# Patient Record
Sex: Female | Born: 1945 | Race: White | Hispanic: No | Marital: Married | Smoking: Never smoker
Health system: Southern US, Community
[De-identification: ages and names within clinical notes are randomized; demographics above are authoritative.]

## PROBLEM LIST (undated history)

## (undated) DIAGNOSIS — I071 Rheumatic tricuspid insufficiency: Secondary | ICD-10-CM

## (undated) DIAGNOSIS — K8050 Calculus of bile duct without cholangitis or cholecystitis without obstruction: Secondary | ICD-10-CM

## (undated) DIAGNOSIS — I4891 Unspecified atrial fibrillation: Secondary | ICD-10-CM

## (undated) DIAGNOSIS — D509 Iron deficiency anemia, unspecified: Secondary | ICD-10-CM

## (undated) DIAGNOSIS — F32A Depression, unspecified: Secondary | ICD-10-CM

## (undated) DIAGNOSIS — Z9884 Bariatric surgery status: Secondary | ICD-10-CM

## (undated) DIAGNOSIS — D649 Anemia, unspecified: Secondary | ICD-10-CM

## (undated) DIAGNOSIS — E282 Polycystic ovarian syndrome: Secondary | ICD-10-CM

## (undated) DIAGNOSIS — M1990 Unspecified osteoarthritis, unspecified site: Secondary | ICD-10-CM

## (undated) HISTORY — DX: Anemia, unspecified: D64.9

## (undated) HISTORY — PX: GASTRIC BYPASS: SHX52

## (undated) HISTORY — DX: Rheumatic tricuspid insufficiency: I07.1

## (undated) HISTORY — DX: Unspecified osteoarthritis, unspecified site: M19.90

## (undated) HISTORY — PX: TONSILLECTOMY: SUR1361

## (undated) HISTORY — DX: Iron deficiency anemia, unspecified: D50.9

## (undated) HISTORY — DX: Depression, unspecified: F32.A

## (undated) HISTORY — DX: Unspecified atrial fibrillation: I48.91

## (undated) HISTORY — PX: CHOLECYSTECTOMY: SHX55

## (undated) HISTORY — DX: Calculus of bile duct without cholangitis or cholecystitis without obstruction: K80.50

## (undated) HISTORY — PX: FOOT FRACTURE SURGERY: SHX645

## (undated) HISTORY — PX: REPLACEMENT TOTAL KNEE: SUR1224

---

## 2014-07-30 DIAGNOSIS — F341 Dysthymic disorder: Secondary | ICD-10-CM | POA: Diagnosis not present

## 2014-08-13 DIAGNOSIS — F341 Dysthymic disorder: Secondary | ICD-10-CM | POA: Diagnosis not present

## 2014-08-27 DIAGNOSIS — F341 Dysthymic disorder: Secondary | ICD-10-CM | POA: Diagnosis not present

## 2014-09-10 DIAGNOSIS — F341 Dysthymic disorder: Secondary | ICD-10-CM | POA: Diagnosis not present

## 2014-09-17 DIAGNOSIS — F341 Dysthymic disorder: Secondary | ICD-10-CM | POA: Diagnosis not present

## 2014-09-24 DIAGNOSIS — F341 Dysthymic disorder: Secondary | ICD-10-CM | POA: Diagnosis not present

## 2014-10-01 DIAGNOSIS — F341 Dysthymic disorder: Secondary | ICD-10-CM | POA: Diagnosis not present

## 2014-10-08 DIAGNOSIS — F341 Dysthymic disorder: Secondary | ICD-10-CM | POA: Diagnosis not present

## 2014-10-15 DIAGNOSIS — F341 Dysthymic disorder: Secondary | ICD-10-CM | POA: Diagnosis not present

## 2014-10-18 DIAGNOSIS — F341 Dysthymic disorder: Secondary | ICD-10-CM | POA: Diagnosis not present

## 2014-10-22 DIAGNOSIS — F341 Dysthymic disorder: Secondary | ICD-10-CM | POA: Diagnosis not present

## 2014-10-26 DIAGNOSIS — F341 Dysthymic disorder: Secondary | ICD-10-CM | POA: Diagnosis not present

## 2014-10-30 DIAGNOSIS — M9903 Segmental and somatic dysfunction of lumbar region: Secondary | ICD-10-CM | POA: Diagnosis not present

## 2014-10-30 DIAGNOSIS — M5431 Sciatica, right side: Secondary | ICD-10-CM | POA: Diagnosis not present

## 2014-10-31 DIAGNOSIS — F341 Dysthymic disorder: Secondary | ICD-10-CM | POA: Diagnosis not present

## 2014-11-05 DIAGNOSIS — F341 Dysthymic disorder: Secondary | ICD-10-CM | POA: Diagnosis not present

## 2014-11-12 DIAGNOSIS — F341 Dysthymic disorder: Secondary | ICD-10-CM | POA: Diagnosis not present

## 2014-11-16 DIAGNOSIS — F341 Dysthymic disorder: Secondary | ICD-10-CM | POA: Diagnosis not present

## 2014-11-19 DIAGNOSIS — F341 Dysthymic disorder: Secondary | ICD-10-CM | POA: Diagnosis not present

## 2015-01-25 DIAGNOSIS — F341 Dysthymic disorder: Secondary | ICD-10-CM | POA: Diagnosis not present

## 2015-01-29 DIAGNOSIS — F341 Dysthymic disorder: Secondary | ICD-10-CM | POA: Diagnosis not present

## 2015-02-04 DIAGNOSIS — F341 Dysthymic disorder: Secondary | ICD-10-CM | POA: Diagnosis not present

## 2015-03-22 DIAGNOSIS — F341 Dysthymic disorder: Secondary | ICD-10-CM | POA: Diagnosis not present

## 2015-03-25 DIAGNOSIS — F341 Dysthymic disorder: Secondary | ICD-10-CM | POA: Diagnosis not present

## 2015-03-29 DIAGNOSIS — F341 Dysthymic disorder: Secondary | ICD-10-CM | POA: Diagnosis not present

## 2015-04-02 DIAGNOSIS — H905 Unspecified sensorineural hearing loss: Secondary | ICD-10-CM | POA: Diagnosis not present

## 2015-04-02 DIAGNOSIS — H9313 Tinnitus, bilateral: Secondary | ICD-10-CM | POA: Diagnosis not present

## 2015-04-04 ENCOUNTER — Other Ambulatory Visit: Payer: Self-pay | Admitting: Otolaryngology

## 2015-04-04 DIAGNOSIS — H9190 Unspecified hearing loss, unspecified ear: Secondary | ICD-10-CM

## 2015-04-17 ENCOUNTER — Ambulatory Visit
Admission: RE | Admit: 2015-04-17 | Discharge: 2015-04-17 | Disposition: A | Discharge status: Home / Self Care | Payer: Medicare Other | Source: Ambulatory Visit | Attending: Otolaryngology | Admitting: Otolaryngology

## 2015-04-17 DIAGNOSIS — H9190 Unspecified hearing loss, unspecified ear: Secondary | ICD-10-CM

## 2015-04-17 DIAGNOSIS — H9193 Unspecified hearing loss, bilateral: Secondary | ICD-10-CM | POA: Diagnosis not present

## 2016-07-31 DIAGNOSIS — M25531 Pain in right wrist: Secondary | ICD-10-CM | POA: Diagnosis not present

## 2016-07-31 DIAGNOSIS — S56011A Strain of flexor muscle, fascia and tendon of right thumb at forearm level, initial encounter: Secondary | ICD-10-CM | POA: Diagnosis not present

## 2016-08-19 DIAGNOSIS — M25531 Pain in right wrist: Secondary | ICD-10-CM | POA: Diagnosis not present

## 2016-08-19 DIAGNOSIS — S56011D Strain of flexor muscle, fascia and tendon of right thumb at forearm level, subsequent encounter: Secondary | ICD-10-CM | POA: Diagnosis not present

## 2017-06-19 ENCOUNTER — Encounter (HOSPITAL_COMMUNITY): Payer: Self-pay | Admitting: Emergency Medicine

## 2017-06-19 ENCOUNTER — Ambulatory Visit (HOSPITAL_COMMUNITY)
Admission: EM | Admit: 2017-06-19 | Discharge: 2017-06-19 | Disposition: A | Discharge status: Home / Self Care | Payer: Medicare Other | Attending: Internal Medicine | Admitting: Internal Medicine

## 2017-06-19 ENCOUNTER — Other Ambulatory Visit: Payer: Self-pay

## 2017-06-19 DIAGNOSIS — R21 Rash and other nonspecific skin eruption: Secondary | ICD-10-CM | POA: Diagnosis not present

## 2017-06-19 HISTORY — DX: Polycystic ovarian syndrome: E28.2

## 2017-06-19 HISTORY — DX: Bariatric surgery status: Z98.84

## 2017-06-19 MED ORDER — FLUCONAZOLE 200 MG PO TABS: 200.0000 mg | ORAL_TABLET | Freq: Every day | ORAL | 0 refills | Status: AC

## 2017-06-19 MED ORDER — TRAZODONE HCL 50 MG PO TABS: 25.0000 mg | ORAL_TABLET | Freq: Every day | ORAL | 0 refills | Status: DC

## 2017-06-19 MED ORDER — TRIAMCINOLONE ACETONIDE 0.1 % EX CREA: 1.0000 "application " | TOPICAL_CREAM | Freq: Two times a day (BID) | CUTANEOUS | 0 refills | Status: DC

## 2017-06-19 MED ORDER — CEPHALEXIN 500 MG PO CAPS: 500.0000 mg | ORAL_CAPSULE | Freq: Two times a day (BID) | ORAL | 0 refills | Status: AC

## 2017-06-19 NOTE — ED Triage Notes (Signed)
Pt states shes been under a lot of stress this year, had a rash starting under her chin that has spread down her neck, across her face, feel itchy and red.

## 2017-06-19 NOTE — ED Provider Notes (Signed)
[facility]    CSN: [id number] Arrival date & time: 06/19/17  1944     History   Chief Complaint Chief Complaint  Patient presents with  . Rash    HPI Hannah Collier is a 71 y.o. female.   She presents today with itchy, burning/irritated red rash in the last couple weeks, involves the chin, neck, upper chest.  No fever, no malaise.  She has a history of psoriasis.  Moved to the area 2-1/2 years ago.  Under a lot of stress, hosted 53 guests including 10 houseguests for Thanksgiving this year at short notice. Has a prescription for fluocinonide 0.1% for psoriasis, has not used it on her face and neck.  Did try using Huggies wipes on her chin and neck, they were cool and felt good, but did not help the rash.   HPI  Past Medical History:  Diagnosis Date  . Gastric bypass status for obesity   . Metabolic syndrome   . PCOS (polycystic ovarian syndrome)       Past Surgical History:  Procedure Laterality Date  . REPLACEMENT TOTAL KNEE    . TONSILLECTOMY        Home Medications    Prior to Admission medications   Medication Sig Start Date End Date Taking? Authorizing Provider  IBUPROFEN PO Take by mouth.   Yes [provider]  Loratadine (CLARITIN PO) Take by mouth.   Yes [provider]  cephALEXin (KEFLEX) 500 MG capsule Take 1 capsule (500 mg total) by mouth 2 (two) times daily for 5 days. 06/19/17 06/24/17  Sherlene Shams, MD  fluconazole (DIFLUCAN) 200 MG tablet Take 1 tablet (200 mg total) by mouth daily for 7 days. 06/19/17 06/26/17  Sherlene Shams, MD  traZODone (DESYREL) 50 MG tablet Take 0.5 tablets (25 mg total) by mouth at bedtime. 06/19/17   Sherlene Shams, MD  triamcinolone cream (KENALOG) 0.1 % Apply 1 application topically 2 (two) times daily. 06/19/17   Sherlene Shams, MD    Family History No family history on file.  Social History Social History   Tobacco Use  . Smoking status: Never Smoker  Substance Use Topics    . Alcohol use: Yes  . Drug use: Not on file     Allergies   Sulfa antibiotics   Review of Systems Review of Systems  All other systems reviewed and are negative.    Physical Exam Triage Vital Signs ED Triage Vitals  Enc Vitals Group     BP 06/19/17 2005 (!) 162/103     Pulse Rate 06/19/17 2005 (!) 116     Resp 06/19/17 2005 18     Temp 06/19/17 2005 98.3 F (36.8 C)     Temp src --      SpO2 06/19/17 2005 100 %     Weight --      Height --      Pain Score 06/19/17 2007 5     Pain Loc --    Updated Vital Signs BP (!) 162/103   Pulse (!) 116 Comment: pt states she has "white coat nervousness"  Temp 98.3 F (36.8 C)   Resp 18   SpO2 100%   Physical Exam  Constitutional: She is oriented to person, place, and time. No distress.  HENT:  Head: Atraumatic.  Eyes:  Conjugate gaze observed, no eye redness/discharge  Neck: Neck supple.  Cardiovascular: Normal rate.  Pulmonary/Chest: No respiratory distress.  Abdominal: She exhibits no distension.  Musculoskeletal:  Normal range of motion.  Neurological: She is alert and oriented to person, place, and time.  Skin: Skin is warm and dry.  Deep red sharply demarcated rash surrounding mouth and extending down from chin to anterior/lateral neck and onto anterior/upper chest.  Scant yellowish/greasy scale on the chin, with a hint of golden crusting.  Rash is excoriated in a couple of spots.  Rash involves the dorsal hands, to a much lesser extent.  The lateral aspect of the left eye is also involved.  Nursing note and vitals reviewed.    UC Treatments / Results   Procedures Procedures (including critical care time) None today  Final Clinical Impressions(s) / UC Diagnoses   Final diagnoses:  Rash and nonspecific skin eruption   Rash on chin and upper chest seems most likely to be seborrheic dermatitis, although contact dermatitis, psoriasis, and impetigo are all possibilities.  Prescriptions for triamcinolone cream,  Keflex (antibiotic), and Diflucan were sent to the pharmacy.  Prescription for trazodone, to help with sleep, was also sent.  Anticipate gradual improvement over the next several days.  Wash face and neck gently with cool water and pat dry it 1-2 times daily.  Apply triamcinolone cream 1-2 times daily, and avoid all other skin care products at this time.  Recheck or follow up with a dermatologist for further evaluation if not improving as expected.  ED Discharge Orders        Ordered    traZODone (DESYREL) 50 MG tablet  Daily at bedtime     06/19/17 2100    cephALEXin (KEFLEX) 500 MG capsule  2 times daily     06/19/17 2100    fluconazole (DIFLUCAN) 200 MG tablet  Daily     06/19/17 2100    triamcinolone cream (KENALOG) 0.1 %  2 times daily     06/19/17 2100       Controlled Substance Prescriptions [place] Controlled Substance Registry consulted? No   Sherlene Shams, MD 06/20/17 2051

## 2017-06-19 NOTE — Discharge Instructions (Addendum)
Rash on chin and upper chest seems most likely to be seborrheic dermatitis, although contact dermatitis, psoriasis, and impetigo are all possibilities.  Prescriptions for triamcinolone cream, Keflex (antibiotic), and Diflucan were sent to the pharmacy.  Prescription for trazodone, to help with sleep, was also sent.  Anticipate gradual improvement over the next several days.  Wash face and neck gently with cool water and pat dry it 1-2 times daily.  Apply triamcinolone cream 1-2 times daily, and avoid all other skin care products at this time.  Recheck or follow up with a dermatologist for further evaluation if not improving as expected.

## 2017-06-22 DIAGNOSIS — L409 Psoriasis, unspecified: Secondary | ICD-10-CM | POA: Diagnosis not present

## 2017-06-22 DIAGNOSIS — L309 Dermatitis, unspecified: Secondary | ICD-10-CM | POA: Diagnosis not present

## 2017-06-22 DIAGNOSIS — Z23 Encounter for immunization: Secondary | ICD-10-CM | POA: Diagnosis not present

## 2017-07-06 DIAGNOSIS — L308 Other specified dermatitis: Secondary | ICD-10-CM | POA: Diagnosis not present

## 2017-07-06 DIAGNOSIS — Z79899 Other long term (current) drug therapy: Secondary | ICD-10-CM | POA: Diagnosis not present

## 2017-07-14 DIAGNOSIS — Z79899 Other long term (current) drug therapy: Secondary | ICD-10-CM | POA: Diagnosis not present

## 2017-07-14 DIAGNOSIS — L308 Other specified dermatitis: Secondary | ICD-10-CM | POA: Diagnosis not present

## 2017-10-06 DIAGNOSIS — L409 Psoriasis, unspecified: Secondary | ICD-10-CM | POA: Diagnosis not present

## 2018-07-27 DIAGNOSIS — K5790 Diverticulosis of intestine, part unspecified, without perforation or abscess without bleeding: Secondary | ICD-10-CM

## 2018-07-27 HISTORY — DX: Diverticulosis of intestine, part unspecified, without perforation or abscess without bleeding: K57.90

## 2018-09-11 ENCOUNTER — Inpatient Hospital Stay (HOSPITAL_COMMUNITY): Payer: Medicare Other

## 2018-09-11 ENCOUNTER — Inpatient Hospital Stay (HOSPITAL_COMMUNITY)
Admission: EM | Admit: 2018-09-11 | Discharge: 2018-09-13 | DRG: 378 | Disposition: A | Discharge status: Home / Self Care | Payer: Medicare Other | Attending: Internal Medicine | Admitting: Internal Medicine

## 2018-09-11 ENCOUNTER — Other Ambulatory Visit: Payer: Self-pay

## 2018-09-11 DIAGNOSIS — R748 Abnormal levels of other serum enzymes: Secondary | ICD-10-CM

## 2018-09-11 DIAGNOSIS — Z7952 Long term (current) use of systemic steroids: Secondary | ICD-10-CM

## 2018-09-11 DIAGNOSIS — D62 Acute posthemorrhagic anemia: Secondary | ICD-10-CM | POA: Diagnosis present

## 2018-09-11 DIAGNOSIS — K222 Esophageal obstruction: Secondary | ICD-10-CM | POA: Diagnosis not present

## 2018-09-11 DIAGNOSIS — Z96659 Presence of unspecified artificial knee joint: Secondary | ICD-10-CM | POA: Diagnosis present

## 2018-09-11 DIAGNOSIS — D509 Iron deficiency anemia, unspecified: Secondary | ICD-10-CM | POA: Diagnosis not present

## 2018-09-11 DIAGNOSIS — K5730 Diverticulosis of large intestine without perforation or abscess without bleeding: Secondary | ICD-10-CM | POA: Diagnosis not present

## 2018-09-11 DIAGNOSIS — L4050 Arthropathic psoriasis, unspecified: Secondary | ICD-10-CM | POA: Diagnosis present

## 2018-09-11 DIAGNOSIS — D122 Benign neoplasm of ascending colon: Secondary | ICD-10-CM | POA: Diagnosis present

## 2018-09-11 DIAGNOSIS — K635 Polyp of colon: Secondary | ICD-10-CM | POA: Diagnosis not present

## 2018-09-11 DIAGNOSIS — E282 Polycystic ovarian syndrome: Secondary | ICD-10-CM

## 2018-09-11 DIAGNOSIS — K828 Other specified diseases of gallbladder: Secondary | ICD-10-CM | POA: Diagnosis not present

## 2018-09-11 DIAGNOSIS — K449 Diaphragmatic hernia without obstruction or gangrene: Secondary | ICD-10-CM | POA: Diagnosis present

## 2018-09-11 DIAGNOSIS — E8881 Metabolic syndrome: Secondary | ICD-10-CM

## 2018-09-11 DIAGNOSIS — F1010 Alcohol abuse, uncomplicated: Secondary | ICD-10-CM

## 2018-09-11 DIAGNOSIS — I361 Nonrheumatic tricuspid (valve) insufficiency: Secondary | ICD-10-CM | POA: Diagnosis not present

## 2018-09-11 DIAGNOSIS — Z881 Allergy status to other antibiotic agents status: Secondary | ICD-10-CM

## 2018-09-11 DIAGNOSIS — G4700 Insomnia, unspecified: Secondary | ICD-10-CM | POA: Diagnosis not present

## 2018-09-11 DIAGNOSIS — Z791 Long term (current) use of non-steroidal anti-inflammatories (NSAID): Secondary | ICD-10-CM

## 2018-09-11 DIAGNOSIS — I4891 Unspecified atrial fibrillation: Secondary | ICD-10-CM | POA: Diagnosis present

## 2018-09-11 DIAGNOSIS — D649 Anemia, unspecified: Secondary | ICD-10-CM | POA: Diagnosis not present

## 2018-09-11 DIAGNOSIS — Z79899 Other long term (current) drug therapy: Secondary | ICD-10-CM | POA: Diagnosis not present

## 2018-09-11 DIAGNOSIS — K922 Gastrointestinal hemorrhage, unspecified: Secondary | ICD-10-CM | POA: Diagnosis present

## 2018-09-11 DIAGNOSIS — I878 Other specified disorders of veins: Secondary | ICD-10-CM | POA: Diagnosis not present

## 2018-09-11 DIAGNOSIS — K5731 Diverticulosis of large intestine without perforation or abscess with bleeding: Principal | ICD-10-CM | POA: Diagnosis present

## 2018-09-11 DIAGNOSIS — K8020 Calculus of gallbladder without cholecystitis without obstruction: Secondary | ICD-10-CM | POA: Diagnosis present

## 2018-09-11 DIAGNOSIS — Z9884 Bariatric surgery status: Secondary | ICD-10-CM | POA: Diagnosis not present

## 2018-09-11 DIAGNOSIS — D120 Benign neoplasm of cecum: Secondary | ICD-10-CM | POA: Diagnosis present

## 2018-09-11 DIAGNOSIS — Z9889 Other specified postprocedural states: Secondary | ICD-10-CM

## 2018-09-11 DIAGNOSIS — K921 Melena: Secondary | ICD-10-CM

## 2018-09-11 DIAGNOSIS — G8929 Other chronic pain: Secondary | ICD-10-CM | POA: Diagnosis not present

## 2018-09-11 LAB — COMPREHENSIVE METABOLIC PANEL
ALT: 44 U/L (ref 0–44)
AST: 192 U/L — ABNORMAL HIGH (ref 15–41)
Albumin: 3.7 g/dL (ref 3.5–5.0)
Alkaline Phosphatase: 125 U/L (ref 38–126)
Anion gap: 14 (ref 5–15)
BUN: 15 mg/dL (ref 8–23)
CALCIUM: 10.5 mg/dL — AB (ref 8.9–10.3)
CO2: 22 mmol/L (ref 22–32)
Chloride: 93 mmol/L — ABNORMAL LOW (ref 98–111)
Creatinine, Ser: 0.87 mg/dL (ref 0.44–1.00)
GFR calc Af Amer: >60 mL/min (ref >60)
Glucose, Bld: 111 mg/dL — ABNORMAL HIGH (ref 70–99)
Potassium: 3.8 mmol/L (ref 3.5–5.1)
Sodium: 129 mmol/L — ABNORMAL LOW (ref 135–145)
Total Bilirubin: 3 mg/dL — ABNORMAL HIGH (ref 0.3–1.2)
Total Protein: 7.5 g/dL (ref 6.5–8.1)

## 2018-09-11 LAB — CBC WITH DIFFERENTIAL/PLATELET
Abs Immature Granulocytes: 0.05 10*3/uL (ref 0.00–0.07)
Basophils Absolute: 0 10*3/uL (ref 0.0–0.1)
Basophils Relative: 1 %
EOS ABS: 0.1 10*3/uL (ref 0.0–0.5)
Eosinophils Relative: 2 %
HCT: 21.9 % — ABNORMAL LOW (ref 36.0–46.0)
Hemoglobin: 6.7 g/dL — CL (ref 12.0–15.0)
Immature Granulocytes: 1 %
Lymphocytes Relative: 11 %
Lymphs Abs: 0.7 10*3/uL (ref 0.7–4.0)
MCH: 22.2 pg — ABNORMAL LOW (ref 26.0–34.0)
MCHC: 30.6 g/dL (ref 30.0–36.0)
MCV: 72.5 fL — ABNORMAL LOW (ref 80.0–100.0)
Monocytes Absolute: 0.6 10*3/uL (ref 0.1–1.0)
Monocytes Relative: 10 %
Neutro Abs: 5.1 10*3/uL (ref 1.7–7.7)
Neutrophils Relative %: 75 %
Platelets: 137 10*3/uL — ABNORMAL LOW (ref 150–400)
RBC: 3.02 MIL/uL — AB (ref 3.87–5.11)
RDW: 22.4 % — ABNORMAL HIGH (ref 11.5–15.5)
WBC: 6.6 10*3/uL (ref 4.0–10.5)
nRBC: 0 % (ref 0.0–0.2)

## 2018-09-11 LAB — I-STAT TROPONIN, ED: Troponin i, poc: 0.01 ng/mL (ref 0.00–0.08)

## 2018-09-11 LAB — POC OCCULT BLOOD, ED: Fecal Occult Bld: POSITIVE — AB

## 2018-09-11 LAB — PROTIME-INR
INR: 1.05
PROTHROMBIN TIME: 13.6 s (ref 11.4–15.2)

## 2018-09-11 LAB — PREPARE RBC (CROSSMATCH)

## 2018-09-11 LAB — ABO/RH: ABO/RH(D): A POS

## 2018-09-11 LAB — GLUCOSE, CAPILLARY: Glucose-Capillary: 99 mg/dL (ref 70–99)

## 2018-09-11 MED ORDER — SODIUM CHLORIDE 0.9 % IV SOLN
50.0000 ug/h | INTRAVENOUS | Status: DC
  Administered 2018-09-11: 50 ug/h via INTRAVENOUS
  Filled 2018-09-11 (×3): qty 1

## 2018-09-11 MED ORDER — SODIUM CHLORIDE 0.9% FLUSH: 3.0000 mL | INTRAVENOUS | Status: DC | PRN

## 2018-09-11 MED ORDER — SODIUM CHLORIDE 0.9% FLUSH
3.0000 mL | Freq: Two times a day (BID) | INTRAVENOUS | Status: DC
  Administered 2018-09-12 – 2018-09-13 (×2): 3 mL via INTRAVENOUS

## 2018-09-11 MED ORDER — SODIUM CHLORIDE 0.9 % IV SOLN
80.0000 mg | Freq: Once | INTRAVENOUS | Status: AC
  Administered 2018-09-11: 80 mg via INTRAVENOUS
  Filled 2018-09-11: qty 80

## 2018-09-11 MED ORDER — ACETAMINOPHEN 325 MG PO TABS: 650.0000 mg | ORAL_TABLET | Freq: Four times a day (QID) | ORAL | Status: DC | PRN

## 2018-09-11 MED ORDER — SODIUM CHLORIDE 0.9 % IV BOLUS
1000.0000 mL | Freq: Once | INTRAVENOUS | Status: AC
  Administered 2018-09-11: 1000 mL via INTRAVENOUS

## 2018-09-11 MED ORDER — SODIUM CHLORIDE 0.9% FLUSH: 3.0000 mL | Freq: Two times a day (BID) | INTRAVENOUS | Status: DC

## 2018-09-11 MED ORDER — LIDOCAINE 5 % EX PTCH
1.0000 | MEDICATED_PATCH | CUTANEOUS | Status: DC | PRN
  Administered 2018-09-11 – 2018-09-13 (×2): 1 via TRANSDERMAL
  Filled 2018-09-11 (×3): qty 1

## 2018-09-11 MED ORDER — SODIUM CHLORIDE 0.9 % IV SOLN: 250.0000 mL | INTRAVENOUS | Status: DC | PRN

## 2018-09-11 MED ORDER — SODIUM CHLORIDE 0.9 % IV SOLN
INTRAVENOUS | Status: DC
  Administered 2018-09-11: 18:00:00 via INTRAVENOUS

## 2018-09-11 MED ORDER — PANTOPRAZOLE SODIUM 40 MG IV SOLR
40.0000 mg | Freq: Once | INTRAVENOUS | Status: DC
  Filled 2018-09-11: qty 40

## 2018-09-11 MED ORDER — SODIUM CHLORIDE 0.9 % IV SOLN
8.0000 mg/h | INTRAVENOUS | Status: DC
  Administered 2018-09-11 – 2018-09-12 (×2): 8 mg/h via INTRAVENOUS
  Filled 2018-09-11 (×4): qty 80

## 2018-09-11 MED ORDER — ACETAMINOPHEN 650 MG RE SUPP: 650.0000 mg | Freq: Four times a day (QID) | RECTAL | Status: DC | PRN

## 2018-09-11 MED ORDER — SENNOSIDES-DOCUSATE SODIUM 8.6-50 MG PO TABS: 1.0000 | ORAL_TABLET | Freq: Every evening | ORAL | Status: DC | PRN

## 2018-09-11 MED ORDER — DICLOFENAC SODIUM 1 % TD GEL
2.0000 g | Freq: Three times a day (TID) | TRANSDERMAL | Status: DC | PRN
  Filled 2018-09-11: qty 100

## 2018-09-11 MED ORDER — SODIUM CHLORIDE 0.9% IV SOLUTION: Freq: Once | INTRAVENOUS | Status: DC

## 2018-09-11 NOTE — Consult Note (Signed)
Consultation  Referring Provider:     [name] MD Primary Care Physician:  Patient, No Pcp Per Primary Gastroenterologist:    UNASSIGNED     Reason for Consultation:     GI bleed         HPI:   Hannah Collier is a 73 y.o. female with a history of Roux-en-Y in 2005, chronic alcohol use, chronic arthritis with NSAID use, presented to the ED with symptoms concerning for GI bleeding, we were consulted to assist in management.  Patient states she has chronic arthritis for which she takes NSAIDs chronically, usually 2-4 per day on average. She has no history of GI bleeding. She also has a history of chronic alcohol use, anywhere from 2-4 drinks per night which she has done for several years. She denies any history of known liver disease or family history of liver disease. She has been in [place] for about 4 years or so.   She presents with multiple episodes of melena which started yesterday. No nausea or vomiting. No pain. She states has been feeling more fatigued than usual since December time frame after she got the flu. No history of GI bleeding. Last colonoscopy "several years ago", does not know results. No FH of GI tract malignancies.   On arrival to the ED she was in A fibb with HR of 105-120 and a Hgb of 6.7. BP stable at 136/98 on presentation. She was given 1 units PRBC and is about to start the next unit. She is feeling better since then and last BM was 5 AM today, none since then. She continues to be AF with HR around 90s.     Past Medical History:  Diagnosis Date  . Gastric bypass status for obesity   . Metabolic syndrome   . PCOS (polycystic ovarian syndrome)     Past Surgical History:  Procedure Laterality Date  . REPLACEMENT TOTAL KNEE    . TONSILLECTOMY      No family history on file. none endorsed  Social History   Tobacco Use  . Smoking status: Never Smoker  Substance Use Topics  . Alcohol use: Yes  . Drug use: Not on file    Prior to  Admission medications   Medication Sig Start Date End Date Taking? Authorizing Provider  cetirizine (KLS ALLER-TEC) 10 MG tablet Take 10 mg by mouth daily.   Yes [provider]  ibuprofen (ADVIL,MOTRIN) 200 MG tablet Take 400-600 mg by mouth 2 (two) times daily.    Yes [provider]  triamcinolone ointment (KENALOG) 0.1 % Apply 1 application topically 2 (two) times daily. 06/16/18  Yes [provider]  traZODone (DESYREL) 50 MG tablet Take 0.5 tablets (25 mg total) by mouth at bedtime. Patient not taking: Reported on 09/11/2018 06/19/17   Wynona Luna, MD  triamcinolone cream (KENALOG) 0.1 % Apply 1 application topically 2 (two) times daily. Patient not taking: Reported on 09/11/2018 06/19/17   Wynona Luna, MD    Current Facility-Administered Medications  Medication Dose Route Frequency Provider Last Rate Last Dose  . 0.9 %  sodium chloride infusion (Manually program via Guardrails IV Fluids)   Intravenous Once [name], MD      . 0.9 %  sodium chloride infusion   Intravenous Continuous [name], MD      . acetaminophen (TYLENOL) tablet 650 mg  650 mg Oral Q6H PRN [name], MD       Or  . acetaminophen (TYLENOL)  suppository 650 mg  650 mg Rectal Q6H PRN [name], MD      . diclofenac sodium (VOLTAREN) 1 % transdermal gel 2 g  2 g Topical TID PRN [name], MD      . lidocaine (LIDODERM) 5 % 1 patch  1 patch Transdermal PRN [name], MD      . pantoprazole (PROTONIX) 80 mg in sodium chloride 0.9 % 250 mL (0.32 mg/mL) infusion  8 mg/hr Intravenous Continuous [name], MD 25 mL/hr at 09/11/18 1449 8 mg/hr at 09/11/18 1449  . senna-docusate (Senokot-S) tablet 1 tablet  1 tablet Oral QHS PRN [name], MD      . sodium chloride flush (NS) 0.9 % injection 3 mL  3 mL Intravenous Q12H [name], MD      . sodium chloride flush (NS) 0.9 % injection 3 mL  3 mL Intravenous PRN  [name], MD        Allergies as of 09/11/2018 - Review Complete 09/11/2018  Allergen Reaction Noted  . Sulfa antibiotics Rash 06/19/2017     Review of Systems:    As per HPI, otherwise negative    Physical Exam:  Vital signs in last 24 hours: Temp:  [98.2 F (36.8 C)-98.4 F (36.9 C)] 98.2 F (36.8 C) (02/16 1314) Pulse Rate:  [82-123] 100 (02/16 1445) Resp:  [17-27] 26 (02/16 1445) BP: (133-163)/(85-103) 149/85 (02/16 1445) SpO2:  [94 %-100 %] 98 % (02/16 1445)   General:   Pleasant female in NAD Head:  Normocephalic and atraumatic. Eyes:   No icterus.   Conjunctiva pink. Ears:  Normal auditory acuity. Neck:  Supple Lungs:  Respirations even and unlabored. Lungs clear to auscultation bilaterally.    Heart:  Irregularly irregular  Abdomen:  Soft, nondistended, nontender.    Rectal:  Not performed.  Msk:  Symmetrical without gross deformities.  Extremities:  Without edema. Neurologic:  Alert and  oriented x4;  grossly normal neurologically. Skin:  Intact without significant lesions or rashes. Psych:  Alert and cooperative. Normal affect.  LAB RESULTS: Recent Labs    09/11/18 1031  WBC 6.6  HGB 6.7*  HCT 21.9*  PLT 137*   BMET Recent Labs    09/11/18 1031  NA 129*  K 3.8  CL 93*  CO2 22  GLUCOSE 111*  BUN 15  CREATININE 0.87  CALCIUM 10.5*   LFT Recent Labs    09/11/18 1031  PROT 7.5  ALBUMIN 3.7  AST 192*  ALT 44  ALKPHOS 125  BILITOT 3.0*   PT/INR Recent Labs    09/11/18 1031  LABPROT 13.6  INR 1.05    STUDIES: No results found.      Impression / Plan:   73 y/o female with history of Roux-en-Y gastric bypass, chronic NSAID use, chronic alcohol use, presenting with multiple episodes of melena, anemia, and new onset atrial fibrillation. Hgb in 6s although BUN not as elevated as would expect. She is very high risk for PUD and I suspect that is the likely cause, however given her alcohol use and mild thrombocytopenia /  bilirubinemia, she is also at risk for cirrhosis and esophageal varices. She seems stable on the ward right now, has an additional unit of blood she is about to receive (2 units total). I have discussed the situation with her and ddx. Recommend upper endoscopy to further evaluate. Will allow her to be resuscitated overnight and ensure AF is stable prior to endoscopy. Discussed risks / benefits she wishes  to proceed.   Recommend: - continue NPO in case of worsening bleeding overnight - continue IV protonix - NO NSAIDs - will add empiric octreotide in case she has underlying cirrhosis which is possible - RUQ [place] to assess for cirrhosis - transfuse to Hgb > 7. Repeat Hgb post transfusion - likely EGD tomorrow if otherwise stable - atrial fibrillation management per primary team, this seems new but stable - discussed alcohol cessation moving forward  Call with questions. Dr. Fuller Plan to assume GI service tomorrow.  [place], MD [facility]

## 2018-09-11 NOTE — Progress Notes (Signed)
Hannah Collier [id number] Admission Data: 09/11/2018 4:57 PM Attending Provider: Oda Kilts, MD  OIB:[id number], No Pcp Per Consults/ Treatment Team: Treatment Team:  Yetta Flock, MD  Hannah Collier is a 73 y.o. female patient admitted from ED awake, alert  & orientated  X 3,  Full Code, VSS - Blood pressure (!) 162/89, pulse 86, temperature 98.5 F (36.9 C), temperature source Oral, resp. rate (!) 22, SpO2 100 %., O2  On RA, no c/o shortness of breath, no c/o chest pain, no distress noted. Tele # [id number] placed and pt is currently running atrial fibrillation in 90's.  Pt orientation to unit, room and routine. Information packet given to patient/family and safety video watched.  Admission INP armband ID verified with patient/family, and in place. SR up x 2, fall risk assessment complete with Patient and family verbalizing understanding of risks associated with falls. Pt verbalizes an understanding of how to use the call bell and to call for help before getting out of bed.  Skin, clean-dry- intact without evidence ofbruising, or skin tears.    Will cont to monitor and assist as needed.  Hiram Comber, RN 09/11/2018 4:57 PM

## 2018-09-11 NOTE — H&P (Addendum)
Date: 09/11/2018               Patient Name:  Hannah Collier MRN: [id number]  DOB: 1945-11-24 Age / Sex: 73 y.o., female   PCP: Patient, No Pcp Per              Medical Service: Internal Medicine Teaching Service              Attending Physician: Dr. Gareth Morgan, MD    First Contact: Elsie Lincoln, Med student Pager: [phone]  Second Contact: Dr. Pearson Grippe Pager: [phone]            After Hours (After 5p/  First Contact Pager: [phone]  weekends / holidays): Second Contact Pager: [phone]   Chief Complaint: Nausea and melena  History of Present Illness: Patient is a 73 year old caucasian female with a PMH of PCOS, metabolic syndrome and gastric bypass surgery who presented to the ED with nausea and dark stools that have been occurring in the last 24 hours. She first noticed that she was becoming acutely more fatigued with nausea in the last 10 days ago. Patient states that she has been feeling progressively weak and shaky since thanksgiving but thought that it was due to the holidays. Patient admits to daily alcohol intake of several glasses of wine along with 2-3 cocktails every evening to cope with life stressors along with daily chronic NSAID use. Patient denies any other associated symptoms like SOB, abdominal pain, frequent vomiting, or chest pain.   Meds: Current Meds  Medication Sig  . cetirizine (KLS ALLER-TEC) 10 MG tablet Take 10 mg by mouth daily.  Marland Kitchen ibuprofen (ADVIL,MOTRIN) 200 MG tablet Take 400-600 mg by mouth 2 (two) times daily.   Marland Kitchen triamcinolone ointment (KENALOG) 0.1 % Apply 1 application topically 2 (two) times daily.   Allergies: Allergies as of 09/11/2018 - Review Complete 09/11/2018  Allergen Reaction Noted  . Sulfa antibiotics Rash 06/19/2017   Past Medical History:  Diagnosis Date  . Gastric bypass status for obesity   . Metabolic syndrome   . PCOS (polycystic ovarian syndrome)     Family History: Maternal history of diabetes  Social History:  Denies tobacco use. Drinks about 6 drinks every evening.  Review of Systems: Review of Systems  Constitutional: Positive for malaise/fatigue. Negative for chills, diaphoresis, fever and weight loss.  Respiratory: Negative for hemoptysis and shortness of breath.   Cardiovascular: Negative for chest pain.  Gastrointestinal: Positive for melena and nausea. Negative for abdominal pain, blood in stool, constipation, diarrhea, heartburn and vomiting.  Psychiatric/Behavioral: Positive for substance abuse. The patient is nervous/anxious and has insomnia.    Physical Exam: Blood pressure (!) 148/103, pulse (!) 122, temperature 98.3 F (36.8 C), temperature source Oral, resp. rate (!) 22, SpO2 99 %.   General: Obese elderly WF in no acute distress laying in bed Lungs: Clear to ausculation, no wheezing or crackles Heart: Irregular rate and rhythm, no murmurs or gallops Abdomen: no tenderness to palpation, hyperactive bowel sounds  EKG: personally reviewed my interpretation is Atrial fibrillation and Right ventricular hypertrophy   CXR: personally reviewed my interpretation is none  Assessment & Plan by Problem: Active Problems:   * No active hospital problems. *  Patient is a 73 year old female who drinks 4-6 drinks everyday and has chronic daily NSAID use who presents to the ED with symptoms of fatigue and weakness that has been acutely getting worse in the last 10 days. Patient admits to being increasingly fatigued  since thanksgiving but did not think anything of it until she started having melena yesterday evening.   Symptomatic anemia 2/2 melena: Presented with 10 days N, Shaking, and decreased appetite, 5 days of weakness, and 2 days of dark stools. On admission Hgb was 6.7. Patient was given 1L NaCl, IV protonix, and 1U pRBCs in ED (1U ordered). GI consulted in the ED. Plan: - Appreciate GI recommendations - Continue protonix gtt - Post transfusion CBC - Maintenance IVF  AFIB:  presented with newly seen AFIB. No prior history. Rate: 100-120. Possible 2/2 relative ischemia in the setting of GI Bleed. - Cardiac monitoring  - Will give PRNs for rate control if needed - No anticoagulation given active bleed  Psoriatic Arthritis: - Patient takes 2-4 tablets of 200 mg of ibuprofen twice a day for pain control. - Discontinue NSAIDs use. - Lidocaine patch and Voltaren gel PRN  PCOS: Stable  Diet: NPO DVT PPx: SCDs due to GI bleed Code status: Full code  Dispo: Admit patient to inpatient with expected length of stay greater than 2 nights   Signed: Elsie Lincoln, Medical Student 09/11/2018, 1:04 PM   Attestation for Student Documentation:  I personally was present and performed or re-performed the history, physical exam and medical decision-making activities of this service and have verified that the service and findings are accurately documented in the student's note with some additions/corrections.  [name], MD 09/11/2018, 3:51 PM

## 2018-09-11 NOTE — H&P (View-Only) (Signed)
Consultation  Referring Provider:     [name] MD Primary Care Physician:  Patient, No Pcp Per Primary Gastroenterologist:    UNASSIGNED     Reason for Consultation:     GI bleed         HPI:   Hannah Collier is a 73 y.o. female with a history of Roux-en-Y in 2005, chronic alcohol use, chronic arthritis with NSAID use, presented to the ED with symptoms concerning for GI bleeding, we were consulted to assist in management.  Patient states she has chronic arthritis for which she takes NSAIDs chronically, usually 2-4 per day on average. She has no history of GI bleeding. She also has a history of chronic alcohol use, anywhere from 2-4 drinks per night which she has done for several years. She denies any history of known liver disease or family history of liver disease. She has been in [place] for about 4 years or so.   She presents with multiple episodes of melena which started yesterday. No nausea or vomiting. No pain. She states has been feeling more fatigued than usual since December time frame after she got the flu. No history of GI bleeding. Last colonoscopy "several years ago", does not know results. No FH of GI tract malignancies.   On arrival to the ED she was in A fibb with HR of 105-120 and a Hgb of 6.7. BP stable at 136/98 on presentation. She was given 1 units PRBC and is about to start the next unit. She is feeling better since then and last BM was 5 AM today, none since then. She continues to be AF with HR around 90s.     Past Medical History:  Diagnosis Date  . Gastric bypass status for obesity   . Metabolic syndrome   . PCOS (polycystic ovarian syndrome)     Past Surgical History:  Procedure Laterality Date  . REPLACEMENT TOTAL KNEE    . TONSILLECTOMY      No family history on file. none endorsed  Social History   Tobacco Use  . Smoking status: Never Smoker  Substance Use Topics  . Alcohol use: Yes  . Drug use: Not on file    Prior to  Admission medications   Medication Sig Start Date End Date Taking? Authorizing Provider  cetirizine (KLS ALLER-TEC) 10 MG tablet Take 10 mg by mouth daily.   Yes [provider]  ibuprofen (ADVIL,MOTRIN) 200 MG tablet Take 400-600 mg by mouth 2 (two) times daily.    Yes [provider]  triamcinolone ointment (KENALOG) 0.1 % Apply 1 application topically 2 (two) times daily. 06/16/18  Yes [provider]  traZODone (DESYREL) 50 MG tablet Take 0.5 tablets (25 mg total) by mouth at bedtime. Patient not taking: Reported on 09/11/2018 06/19/17   Wynona Luna, MD  triamcinolone cream (KENALOG) 0.1 % Apply 1 application topically 2 (two) times daily. Patient not taking: Reported on 09/11/2018 06/19/17   Wynona Luna, MD    Current Facility-Administered Medications  Medication Dose Route Frequency Provider Last Rate Last Dose  . 0.9 %  sodium chloride infusion (Manually program via Guardrails IV Fluids)   Intravenous Once [name], MD      . 0.9 %  sodium chloride infusion   Intravenous Continuous [name], MD      . acetaminophen (TYLENOL) tablet 650 mg  650 mg Oral Q6H PRN [name], MD       Or  . acetaminophen (TYLENOL)  suppository 650 mg  650 mg Rectal Q6H PRN [name], MD      . diclofenac sodium (VOLTAREN) 1 % transdermal gel 2 g  2 g Topical TID PRN [name], MD      . lidocaine (LIDODERM) 5 % 1 patch  1 patch Transdermal PRN [name], MD      . pantoprazole (PROTONIX) 80 mg in sodium chloride 0.9 % 250 mL (0.32 mg/mL) infusion  8 mg/hr Intravenous Continuous [name], MD 25 mL/hr at 09/11/18 1449 8 mg/hr at 09/11/18 1449  . senna-docusate (Senokot-S) tablet 1 tablet  1 tablet Oral QHS PRN [name], MD      . sodium chloride flush (NS) 0.9 % injection 3 mL  3 mL Intravenous Q12H [name], MD      . sodium chloride flush (NS) 0.9 % injection 3 mL  3 mL Intravenous PRN  [name], MD        Allergies as of 09/11/2018 - Review Complete 09/11/2018  Allergen Reaction Noted  . Sulfa antibiotics Rash 06/19/2017     Review of Systems:    As per HPI, otherwise negative    Physical Exam:  Vital signs in last 24 hours: Temp:  [98.2 F (36.8 C)-98.4 F (36.9 C)] 98.2 F (36.8 C) (02/16 1314) Pulse Rate:  [82-123] 100 (02/16 1445) Resp:  [17-27] 26 (02/16 1445) BP: (133-163)/(85-103) 149/85 (02/16 1445) SpO2:  [94 %-100 %] 98 % (02/16 1445)   General:   Pleasant female in NAD Head:  Normocephalic and atraumatic. Eyes:   No icterus.   Conjunctiva pink. Ears:  Normal auditory acuity. Neck:  Supple Lungs:  Respirations even and unlabored. Lungs clear to auscultation bilaterally.    Heart:  Irregularly irregular  Abdomen:  Soft, nondistended, nontender.    Rectal:  Not performed.  Msk:  Symmetrical without gross deformities.  Extremities:  Without edema. Neurologic:  Alert and  oriented x4;  grossly normal neurologically. Skin:  Intact without significant lesions or rashes. Psych:  Alert and cooperative. Normal affect.  LAB RESULTS: Recent Labs    09/11/18 1031  WBC 6.6  HGB 6.7*  HCT 21.9*  PLT 137*   BMET Recent Labs    09/11/18 1031  NA 129*  K 3.8  CL 93*  CO2 22  GLUCOSE 111*  BUN 15  CREATININE 0.87  CALCIUM 10.5*   LFT Recent Labs    09/11/18 1031  PROT 7.5  ALBUMIN 3.7  AST 192*  ALT 44  ALKPHOS 125  BILITOT 3.0*   PT/INR Recent Labs    09/11/18 1031  LABPROT 13.6  INR 1.05    STUDIES: No results found.      Impression / Plan:   73 y/o female with history of Roux-en-Y gastric bypass, chronic NSAID use, chronic alcohol use, presenting with multiple episodes of melena, anemia, and new onset atrial fibrillation. Hgb in 6s although BUN not as elevated as would expect. She is very high risk for PUD and I suspect that is the likely cause, however given her alcohol use and mild thrombocytopenia /  bilirubinemia, she is also at risk for cirrhosis and esophageal varices. She seems stable on the ward right now, has an additional unit of blood she is about to receive (2 units total). I have discussed the situation with her and ddx. Recommend upper endoscopy to further evaluate. Will allow her to be resuscitated overnight and ensure AF is stable prior to endoscopy. Discussed risks / benefits she wishes  to proceed.   Recommend: - continue NPO in case of worsening bleeding overnight - continue IV protonix - NO NSAIDs - will add empiric octreotide in case she has underlying cirrhosis which is possible - RUQ [place] to assess for cirrhosis - transfuse to Hgb > 7. Repeat Hgb post transfusion - likely EGD tomorrow if otherwise stable - atrial fibrillation management per primary team, this seems new but stable - discussed alcohol cessation moving forward  Call with questions. Dr. Fuller Plan to assume GI service tomorrow.  Georgetown Cellar, MD [facility]

## 2018-09-11 NOTE — ED Provider Notes (Signed)
[place] EMERGENCY DEPARTMENT Provider Note   CSN: [id number] Arrival date & time: 09/11/18  5284     History   Chief Complaint Chief Complaint  Patient presents with  . Rectal Bleeding    HPI Hannah Collier is a 73 y.o. female with history of psoriatic arthritis is here for evaluation of blood in her stool.  First noticed last night.  Describes BM as "blacking tarry".  Total of 4 bowel movements that are formed, painless.  The last one was at 5 AM today.  Associated symptoms include feeling generally weak for the last 4 to 5 days, chest fullness, nausea and shakiness.  Has had decreased appetite and has not eaten in 3 days.  Reports significant stress at home and initially thought it was because of stress. Takes 400 mg of ibuprofen twice daily daily for her chronic arthralgias.  Drinks 5 alcoholic beverages every day for a long time.  She has been drinking more ETOH to help with stress. No interventions.  No alleviating or aggravating factors.  She denies associated exertional chest pain or shortness of breath, palpitations, nausea, vomiting, hematemesis, abdominal pain, diarrhea.  She is not on blood thinners.  No history of ulcers in the past or GI bleeding.  HPI  Past Medical History:  Diagnosis Date  . Gastric bypass status for obesity   . Metabolic syndrome   . PCOS (polycystic ovarian syndrome)     There are no active problems to display for this patient.   Past Surgical History:  Procedure Laterality Date  . REPLACEMENT TOTAL KNEE    . TONSILLECTOMY       OB History   No obstetric history on file.      Home Medications    Prior to Admission medications   Medication Sig Start Date End Date Taking? Authorizing Provider  cetirizine (KLS ALLER-TEC) 10 MG tablet Take 10 mg by mouth daily.   Yes [provider]  ibuprofen (ADVIL,MOTRIN) 200 MG tablet Take 400-600 mg by mouth 2 (two) times daily.    Yes [provider]    triamcinolone ointment (KENALOG) 0.1 % Apply 1 application topically 2 (two) times daily. 06/16/18  Yes [provider]  traZODone (DESYREL) 50 MG tablet Take 0.5 tablets (25 mg total) by mouth at bedtime. Patient not taking: Reported on 09/11/2018 06/19/17   Wynona Luna, MD  triamcinolone cream (KENALOG) 0.1 % Apply 1 application topically 2 (two) times daily. Patient not taking: Reported on 09/11/2018 06/19/17   Wynona Luna, MD    Family History No family history on file.  Social History Social History   Tobacco Use  . Smoking status: Never Smoker  Substance Use Topics  . Alcohol use: Yes  . Drug use: Not on file     Allergies   Sulfa antibiotics   Review of Systems Review of Systems  Constitutional: Positive for fatigue.  Cardiovascular:       Chest fullness   Gastrointestinal: Positive for blood in stool and nausea.  Neurological: Positive for weakness.     Physical Exam Updated Vital Signs BP (!) 136/98   Pulse (!) 104   Temp 98.2 F (36.8 C) (Oral)   Resp (!) 22   SpO2 100%   Physical Exam   ED Treatments / Results  Labs (all labs ordered are listed, but only abnormal results are displayed) Labs Reviewed  COMPREHENSIVE METABOLIC PANEL - Abnormal; Notable for the following components:      Result  Value   Sodium 129 (*)    Chloride 93 (*)    Glucose, Bld 111 (*)    Calcium 10.5 (*)    AST 192 (*)    Total Bilirubin 3.0 (*)    All other components within normal limits  CBC WITH DIFFERENTIAL/PLATELET - Abnormal; Notable for the following components:   RBC 3.02 (*)    Hemoglobin 6.7 (*)    HCT 21.9 (*)    MCV 72.5 (*)    MCH 22.2 (*)    RDW 22.4 (*)    Platelets 137 (*)    All other components within normal limits  POC OCCULT BLOOD, ED - Abnormal; Notable for the following components:   Fecal Occult Bld POSITIVE (*)    All other components within normal limits  PROTIME-INR  I-STAT TROPONIN, ED  TYPE AND SCREEN   ABO/RH  PREPARE RBC (CROSSMATCH)    EKG EKG Interpretation  Date/Time:  Sunday September 11 2018 10:07:25 EST Ventricular Rate:  118 PR Interval:    QRS Duration: 100 QT Interval:  342 QTC Calculation: 480 R Axis:   -169 Text Interpretation:  Atrial fibrillation Right ventricular hypertrophy Inferior infarct, old Anterolateral infarct, age indeterminate Baseline wander in lead(s) V4 No previous ECGs available Confirmed by Gareth Morgan [phone]) on 09/11/2018 10:19:19 AM Also confirmed by Gareth Morgan [phone]), editor Radene Gunning [phone])  on 09/11/2018 12:48:09 PM   Radiology No results found.  Procedures .Critical Care Performed by: Kinnie Feil, PA-C Authorized by: Kinnie Feil, PA-C   Critical care provider statement:    Critical care time (minutes):  45   Critical care was necessary to treat or prevent imminent or life-threatening deterioration of the following conditions: Gi bleed, symptomatic anemia, atrial fibrillation.   Critical care was time spent personally by me on the following activities:  Discussions with consultants, evaluation of patient's response to treatment, examination of patient, ordering and performing treatments and interventions, ordering and review of laboratory studies, ordering and review of radiographic studies, pulse oximetry, re-evaluation of patient's condition, obtaining history from patient or surrogate and review of old charts   I assumed direction of critical care for this patient from another provider in my specialty: no     (including critical care time)  Medications Ordered in ED Medications  pantoprazole (PROTONIX) 80 mg in sodium chloride 0.9 % 100 mL IVPB (has no administration in time range)  pantoprazole (PROTONIX) 80 mg in sodium chloride 0.9 % 250 mL (0.32 mg/mL) infusion (has no administration in time range)  0.9 %  sodium chloride infusion (Manually program via Guardrails IV Fluids) (has no administration in time  range)  sodium chloride 0.9 % bolus 1,000 mL (0 mLs Intravenous Stopped 09/11/18 1149)     Initial Impression / Assessment and Plan / ED Course  I have reviewed the triage vital signs and the nursing notes.  Pertinent labs & imaging results that were available during my care of the patient were reviewed by me and considered in my medical decision making (see chart for details).  Clinical Course as of Sep 12 1327  Sun Sep 11, 2018  1211 Hemoglobin(!!): 6.7 [CG]  1211 Sodium(!): 129 [CG]  1211 AST(!): 192 [CG]    Clinical Course User Index [CG] Kinnie Feil, PA-C   73 year old with melena.  Reports symptoms of low hemoglobin.  On exam she is tachycardic, and A. fib heart rate 105-120.  HD stable.  Melanotic stool noted.  No abdominal tenderness.  Hemoglobin  6.7.  Positive Hemoccult.  Sodium 129.  AST 192, total bili 3.0 but no abd tenderness. Negative Murphy's.   1327: I have spoken with GI who will follow patient once admitted.  Spoke to internal medicine teaching service who has accepted patient.  Symptomatic anemia, GI bleed, atrial fibrillation possibly from demand.  She is received 1 L IV fluids, Protonix bolus/drip, PRBCs running. Final Clinical Impressions(s) / ED Diagnoses   Final diagnoses:  Gastrointestinal hemorrhage with melena  Symptomatic anemia    ED Discharge Orders    None       Arlean Hopping 09/11/18 1329    Gareth Morgan, MD 09/11/18 2227

## 2018-09-11 NOTE — Progress Notes (Signed)
Paged MD Rehman at [phone] to inquire if want a repeat H&H now instead of waiting for AM CBC at 0500. MD Reham called back and order placed for H&H.  RN failed IV attempt. IV team consult placed at 2351. IV team not able to start new IV to continue Ocreotide. 2nd IV team RN  consulted.   Notified MD Rehman via page at [facility] that lost IV access and awaiting new IV start to continue infusing Ocreotide and notified of labs Hgb 8.9 and Hct 25.1   Notified MD Rehman of patient BP 144/104 and no morning lab for CBC. Patient not in distress. MD Rehman indicated will inform day team and will likely order CBC.

## 2018-09-11 NOTE — ED Triage Notes (Signed)
Pt reports that for 10 days she has been feeling nauseous, shaky, and having a decrease in appetite. Pt reports that she has been feeling weak for the past 5 days. Pt states that she has had about 3 dark stools with the last one being at 0500 this morning. Pt denies any shortness of breath or abdominal pain. Pt reports she has been drinking several glasses of wine and 2-3 cocktails every evening to help cope with stress.

## 2018-09-11 NOTE — Progress Notes (Signed)
2nd unit of PRBC started at this time. Blood dual signed off with Markus Jarvis, RN. Patient educated on signs and symptoms of transfusion reaction. This RN stayed at bedside for the first 15 minutes of transfusion without any s/s of a reaction noted. Will continue to monitor patient.

## 2018-09-12 ENCOUNTER — Inpatient Hospital Stay (HOSPITAL_COMMUNITY): Payer: Medicare Other | Admitting: Anesthesiology

## 2018-09-12 ENCOUNTER — Encounter (HOSPITAL_COMMUNITY)
Admission: EM | Disposition: A | Discharge status: Home / Self Care | Payer: Self-pay | Source: Home / Self Care | Attending: Internal Medicine

## 2018-09-12 ENCOUNTER — Encounter (HOSPITAL_COMMUNITY): Payer: Self-pay | Admitting: *Deleted

## 2018-09-12 DIAGNOSIS — K828 Other specified diseases of gallbladder: Secondary | ICD-10-CM

## 2018-09-12 DIAGNOSIS — D649 Anemia, unspecified: Secondary | ICD-10-CM | POA: Diagnosis present

## 2018-09-12 DIAGNOSIS — G8929 Other chronic pain: Secondary | ICD-10-CM

## 2018-09-12 DIAGNOSIS — K449 Diaphragmatic hernia without obstruction or gangrene: Secondary | ICD-10-CM

## 2018-09-12 DIAGNOSIS — K8020 Calculus of gallbladder without cholecystitis without obstruction: Secondary | ICD-10-CM

## 2018-09-12 DIAGNOSIS — I878 Other specified disorders of veins: Secondary | ICD-10-CM

## 2018-09-12 HISTORY — PX: ESOPHAGOGASTRODUODENOSCOPY (EGD) WITH PROPOFOL: SHX5813

## 2018-09-12 LAB — TYPE AND SCREEN
ABO/RH(D): A POS
Antibody Screen: NEGATIVE
UNIT DIVISION: 0
Unit division: 0

## 2018-09-12 LAB — HEPATIC FUNCTION PANEL
ALT: 41 U/L (ref 0–44)
AST: 111 U/L — ABNORMAL HIGH (ref 15–41)
Albumin: 3.6 g/dL (ref 3.5–5.0)
Alkaline Phosphatase: 112 U/L (ref 38–126)
Bilirubin, Direct: 1.4 mg/dL — ABNORMAL HIGH (ref 0.0–0.2)
Indirect Bilirubin: 1.8 mg/dL — ABNORMAL HIGH (ref 0.3–0.9)
Total Bilirubin: 3.2 mg/dL — ABNORMAL HIGH (ref 0.3–1.2)
Total Protein: 6.6 g/dL (ref 6.5–8.1)

## 2018-09-12 LAB — BASIC METABOLIC PANEL
Anion gap: 11 (ref 5–15)
BUN: 10 mg/dL (ref 8–23)
CO2: 22 mmol/L (ref 22–32)
Calcium: 9.1 mg/dL (ref 8.9–10.3)
Chloride: 99 mmol/L (ref 98–111)
Creatinine, Ser: 0.92 mg/dL (ref 0.44–1.00)
GFR calc Af Amer: >60 mL/min (ref >60)
GFR calc non Af Amer: >60 mL/min (ref >60)
Glucose, Bld: 112 mg/dL — ABNORMAL HIGH (ref 70–99)
Potassium: 3.8 mmol/L (ref 3.5–5.1)
Sodium: 132 mmol/L — ABNORMAL LOW (ref 135–145)

## 2018-09-12 LAB — BPAM RBC
BLOOD PRODUCT EXPIRATION DATE: 202003092359
Blood Product Expiration Date: 202003092359
ISSUE DATE / TIME: 202002161244
ISSUE DATE / TIME: 202002161635
Unit Type and Rh: 6200
Unit Type and Rh: 6200

## 2018-09-12 LAB — GLUCOSE, CAPILLARY
Glucose-Capillary: 101 mg/dL — ABNORMAL HIGH (ref 70–99)
Glucose-Capillary: 102 mg/dL — ABNORMAL HIGH (ref 70–99)
Glucose-Capillary: 97 mg/dL (ref 70–99)
Glucose-Capillary: 147 mg/dL — ABNORMAL HIGH (ref 70–99)
Glucose-Capillary: 138 mg/dL — ABNORMAL HIGH (ref 70–99)

## 2018-09-12 LAB — CBC
HEMATOCRIT: 26.5 % — AB (ref 36.0–46.0)
Hemoglobin: 8.1 g/dL — ABNORMAL LOW (ref 12.0–15.0)
MCH: 23.3 pg — ABNORMAL LOW (ref 26.0–34.0)
MCHC: 30.6 g/dL (ref 30.0–36.0)
MCV: 76.4 fL — ABNORMAL LOW (ref 80.0–100.0)
Platelets: 101 10*3/uL — ABNORMAL LOW (ref 150–400)
RBC: 3.47 MIL/uL — ABNORMAL LOW (ref 3.87–5.11)
RDW: 23.1 % — ABNORMAL HIGH (ref 11.5–15.5)
WBC: 3.8 10*3/uL — AB (ref 4.0–10.5)
nRBC: 0 % (ref 0.0–0.2)

## 2018-09-12 LAB — HEMOGLOBIN AND HEMATOCRIT, BLOOD
HCT: 25.9 % — ABNORMAL LOW (ref 36.0–46.0)
Hemoglobin: 8.1 g/dL — ABNORMAL LOW (ref 12.0–15.0)

## 2018-09-12 SURGERY — ESOPHAGOGASTRODUODENOSCOPY (EGD) WITH PROPOFOL
Anesthesia: Monitor Anesthesia Care

## 2018-09-12 MED ORDER — ONDANSETRON HCL 4 MG/2ML IJ SOLN
INTRAMUSCULAR | Status: DC | PRN
  Administered 2018-09-12: 4 mg via INTRAVENOUS

## 2018-09-12 MED ORDER — PANTOPRAZOLE SODIUM 40 MG PO TBEC
40.0000 mg | DELAYED_RELEASE_TABLET | Freq: Every day | ORAL | Status: DC
  Administered 2018-09-12 – 2018-09-13 (×2): 40 mg via ORAL
  Filled 2018-09-12 (×2): qty 1

## 2018-09-12 MED ORDER — SODIUM CHLORIDE 0.9 % IV SOLN
INTRAVENOUS | Status: DC | PRN
  Administered 2018-09-12 (×2): via INTRAVENOUS

## 2018-09-12 MED ORDER — NON FORMULARY: Freq: Two times a day (BID) | Status: DC | PRN

## 2018-09-12 MED ORDER — PEG-KCL-NACL-NASULF-NA ASC-C 100 G PO SOLR
0.5000 | Freq: Once | ORAL | Status: AC
  Administered 2018-09-12: 100 g via ORAL

## 2018-09-12 MED ORDER — PROPOFOL 500 MG/50ML IV EMUL
INTRAVENOUS | Status: DC | PRN
  Administered 2018-09-12: 100 ug/kg/min via INTRAVENOUS

## 2018-09-12 MED ORDER — LIDOCAINE HCL (CARDIAC) PF 100 MG/5ML IV SOSY
PREFILLED_SYRINGE | INTRAVENOUS | Status: DC | PRN
  Administered 2018-09-12: 60 mg via INTRATRACHEAL

## 2018-09-12 MED ORDER — PEG-KCL-NACL-NASULF-NA ASC-C 100 G PO SOLR
0.5000 | Freq: Once | ORAL | Status: AC
  Administered 2018-09-12: 100 g via ORAL
  Filled 2018-09-12: qty 1

## 2018-09-12 MED ORDER — PROPOFOL 10 MG/ML IV BOLUS
INTRAVENOUS | Status: DC | PRN
  Administered 2018-09-12: 30 mg via INTRAVENOUS

## 2018-09-12 MED ORDER — PEG-KCL-NACL-NASULF-NA ASC-C 100 G PO SOLR: 1.0000 | Freq: Once | ORAL | Status: DC

## 2018-09-12 MED ORDER — TRIAMCINOLONE ACETONIDE 0.1 % EX OINT: TOPICAL_OINTMENT | Freq: Two times a day (BID) | CUTANEOUS | Status: DC | PRN

## 2018-09-12 SURGICAL SUPPLY — 15 items

## 2018-09-12 NOTE — Op Note (Addendum)
[facility] Patient Name: Hannah Collier Procedure Date : 09/12/2018 MRN: [id number] Attending MD: Ladene Artist , MD Date of Birth: 1946-03-21 CSN: [id number] Age: 73 Admit Type: Inpatient Procedure:                Upper GI endoscopy Indications:              Acute post hemorrhagic anemia, Microcytic anemia,                            Melena Providers:                Pricilla Riffle. Fuller Plan, MD, Dorise Hiss, RN, Vista Lawman, RN, Charolette Child, Technician, Haze Boyden, CRNA Referring MD:             Lenice Pressman, MD Medicines:                Monitored Anesthesia Care Complications:            No immediate complications. Estimated Blood Loss:     Estimated blood loss: none. Procedure:                Pre-Anesthesia Assessment:                           - Prior to the procedure, a History and Physical                            was performed, and patient medications and                            allergies were reviewed. The patient's tolerance of                            previous anesthesia was also reviewed. The risks                            and benefits of the procedure and the sedation                            options and risks were discussed with the patient.                            All questions were answered, and informed consent                            was obtained. Prior Anticoagulants: The patient has                            taken no previous anticoagulant or antiplatelet                            agents.  ASA Grade Assessment: II - A patient with                            mild systemic disease. After reviewing the risks                            and benefits, the patient was deemed in                            satisfactory condition to undergo the procedure.                           After obtaining informed consent, the endoscope was                            passed under direct  vision. Throughout the                            procedure, the patient's blood pressure, pulse, and                            oxygen saturations were monitored continuously. The                            GIF-H190 ([id number] Olympus gastroscope was                            introduced through the mouth, and advanced to the                            proximal jejunum. The upper GI endoscopy was                            accomplished without difficulty. The patient                            tolerated the procedure well. Scope In: Scope Out: Findings:      One benign-appearing, intrinsic, mild, nonobstructing stenosis was found       40 cm from the incisors. This stenosis measured 1.5 cm (inner diameter).       The stenosis was not traversed.      The examined esophagus was otherwise normal.      Evidence of a gastric bypass was found. A gastric pouch with a normal       size was found. The staple line appeared intact. The gastrojejunal       anastomosis was characterized by healthy appearing mucosa, mildly       friable and visible sutures. This was traversed. The pouch-to-jejunum       limb measured 5 cm from anastomosis and was characterized by healthy       appearing mucosa. Scope was likely in the anastomosis or jejunum on       attempts to retroflex. The scope could not be positioned away from the       mucosa to have a even a partial view.      A  small hiatal hernia was present.      The examined jejunum was normal, approximately 40 cm examined. 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                           - Gastric bypass with a normal-sized pouch and                            intact staple line. Gastrojejunal anastomosis                            characterized by healthy appearing mucosa and                            visible sutures.                           - Small hiatal hernia.                           - Normal examined  jejunum.                           - No specimens collected. Recommendation:           - Return patient to hospital ward for ongoing care.                           - Clear liquid diet today.                           - Continue present medications except change                            pantoprazole to 40 mg PO qam and discontinue                            octreotide (no evidence of cirrhosis or portal HTN                            on RUQ [place], EGD).                           - Trend CBC.                           - No aspirin, ibuprofen, naproxen, or other                            non-steroidal anti-inflammatory drugs.                           - Discontinue etoh use.                           - Presumed iron deficiency leading to microcytosis.  Recommend iron replacement after colonoscopy                            completed.                           - Perform a colonoscopy tomorrow. Procedure Code(s):        --- Professional ---                           [phone], Esophagogastroduodenoscopy, flexible,                            transoral; diagnostic, including collection of                            specimen(s) by brushing or washing, when performed                            (separate procedure) Diagnosis Code(s):        --- Professional ---                           D62.22, Bariatric surgery status                           K44.9, Diaphragmatic hernia without obstruction or                            gangrene                           D62, Acute posthemorrhagic anemia                           K92.1, Melena (includes Hematochezia) CPT copyright 2018 American Medical Association. All rights reserved. The codes documented in this report are preliminary and upon coder review may  be revised to meet current compliance requirements. Ladene Artist, MD 09/12/2018 11:20:45 AM This report has been signed electronically. Number of Addenda: 0

## 2018-09-12 NOTE — Plan of Care (Signed)

## 2018-09-12 NOTE — Transfer of Care (Signed)
Immediate Anesthesia Transfer of Care Note  Patient: Hannah Collier  Procedure(s) Performed: ESOPHAGOGASTRODUODENOSCOPY (EGD) WITH PROPOFOL (N/A )  Patient Location: Short Stay  Anesthesia Type:MAC  Level of Consciousness: awake and patient cooperative  Airway & Oxygen Therapy: Patient Spontanous Breathing and Patient connected to nasal cannula oxygen  Post-op Assessment: Report given to RN and Post -op Vital signs reviewed and stable  Post vital signs: Reviewed and stable  Last Vitals:  Vitals Value Taken Time  BP 111/53 09/12/2018 11:12 AM  Temp 36.5 C 09/12/2018 11:12 AM  Pulse 96 09/12/2018 11:16 AM  Resp 16 09/12/2018 11:16 AM  SpO2 97 % 09/12/2018 11:16 AM  Vitals shown include unvalidated device data.  Last Pain:  Vitals:   09/12/18 1112  TempSrc: Oral  PainSc: 0-No pain         Complications: No apparent anesthesia complications

## 2018-09-12 NOTE — Anesthesia Preprocedure Evaluation (Addendum)
Anesthesia Evaluation  Patient identified by MRN, date of birth, ID band Patient awake    Reviewed: Allergy & Precautions, NPO status , Patient's Chart, lab work & pertinent test results  Airway Mallampati: II  TM Distance: >3 FB Neck ROM: Full    Dental  (+) Teeth Intact, Dental Advisory Given   Pulmonary neg pulmonary ROS,    Pulmonary exam normal breath sounds clear to auscultation       Cardiovascular negative cardio ROS Normal cardiovascular exam Rhythm:Regular Rate:Normal     Neuro/Psych negative neurological ROS     GI/Hepatic negative GI ROS, Neg liver ROS, S/p gastric bypass   Endo/Other  negative endocrine ROSObesity   Renal/GU negative Renal ROS     Musculoskeletal  (+) Arthritis ,   Abdominal   Peds  Hematology  (+) Blood dyscrasia (Thrombocytopenia), anemia ,   Anesthesia Other Findings Day of surgery medications reviewed with the patient.  Reproductive/Obstetrics                            Anesthesia Physical Anesthesia Plan  ASA: II  Anesthesia Plan: MAC   Post-op Pain Management:    Induction: Intravenous  PONV Risk Score and Plan: 2 and Propofol infusion and Treatment may vary due to age or medical condition  Airway Management Planned: Nasal Cannula and Natural Airway  Additional Equipment:   Intra-op Plan:   Post-operative Plan:   Informed Consent: I have reviewed the patients History and Physical, chart, labs and discussed the procedure including the risks, benefits and alternatives for the proposed anesthesia with the patient or authorized representative who has indicated his/her understanding and acceptance.     Dental advisory given  Plan Discussed with: CRNA  Anesthesia Plan Comments:        Anesthesia Quick Evaluation

## 2018-09-12 NOTE — Progress Notes (Signed)
  Date: 09/12/2018  Patient name: Hannah Collier  Medical record number: [id number]  Date of birth: 09-24-1945   I have seen and evaluated this patient and I have discussed the plan of care with the house staff. Please see their note for complete details. I concur with their findings with the following additions/corrections:   Feeling much better today after 2 units PRBCs.  Able to go for EGD this morning, no actively bleeding lesions, gastric bypass anastomosis looked intact with only mild friability.  Planning for colonoscopy tomorrow.  She remains in atrial fibrillation with good rate control.  Discussed with her and her husband that she is not safe to start anticoagulation at this point.  We also will not plan rhythm control for her until she is more stable from a GI standpoint.  Her cardiac exam today is also significant for JVD to the angle of the mandible sitting upright with abdominojugular reflex.  This is suggestive of elevated CVP, which may not have been elevated when she came in due to blood loss.  We will obtain an echocardiogram to further evaluate her atrial fibrillation and JVD.  We have discussed with her that she should abstain from alcohol completely going forward to help prevent further bleeding episodes and given the evidence of fatty liver disease on her ultrasound.  We will need to work with her to find alternatives to ibuprofen for her psoriatic arthritis as well, although she feels she is doing very well with a lidocaine patch for her back pain.  She also has chronic hand and foot pain which may prove more difficult to manage.  Lenice Pressman, M.D., Ph.D. 09/12/2018, 5:15 PM

## 2018-09-12 NOTE — Progress Notes (Signed)
Subjective: Patient was examined at bedside this afternoon. She was feeling a lot better and does not complain of any nausea, fatigue or abdominal pain. Patient's EGD was negative for any acute bleeding and she is scheduled to have a colonoscopy for tomorrow morning.  We discussed her lab results including signs of fatty liver and common bile duct dilation on her [place] and what that may mean. We discussed alcohol and NSAID cessation and she understands the need for this. We also discussed getting an echo to evaluate her heart for sources of her Afib, and clots, and possible heart failure given her dilated neck veins.   She is interested in following up in our clinic as well as a referral to rheumatology for her psoriatic arthritis.  Objective:  Vital signs in last 24 hours: Vitals:   09/12/18 0114 09/12/18 0115 09/12/18 0612 09/12/18 1008  BP:  103/86 (!) 144/104 140/90  Pulse:  96 91 97  Resp:  (!) 25 (!) 23 18  Temp: 98 F (36.7 C) 98 F (36.7 C) 97.8 F (36.6 C) 97.8 F (36.6 C)  TempSrc: Oral Oral Oral Oral  SpO2:  98% 95% 100%  Weight:    93 kg  Height:    5\' 8"  (1.727 m)   Weight change:   Intake/Output Summary (Last 24 hours) at 09/12/2018 1058 Last data filed at 09/11/2018 1951 Gross per 24 hour  Intake 1497.08 ml  Output -  Net 1497.08 ml   General: Elderly obese WF laying comfortably in bed in no acute distress Neck: Positive JVP and hepatojugular reflex Heart: irregular heart rate and rhythm, no murmurs or gallops Lungs: clear to ausculation, no wheezing or crackles Abdomen: no tenderness to palpation Extremities: 1+ pitting edema to mud shin bilat  Assessment/Plan:  Principal Problem:   GI bleed Active Problems:   New onset a-fib (HCC)   Psoriatic arthritis (HCC)   Acute blood loss anemia   Patient is a 73 year old female who drinks 4-6 drinks everyday and has chronic daily NSAID use who presents to the ED with symptoms of fatigue and weakness that has been  acutely getting worse in the last 10 days. Patient admits to being increasingly fatigued since thanksgiving but did not think anything of it until she started having melena yesterday evening.   #Symptomatic anemia 2/2 melena: Presented with 10 days N, Shaking, and decreased appetite, 5 days of weakness, and 2 days of dark stools. On admission Hgb was 6.7. Patient was given 1L NaCl, IV protonix, and 2U pRBCs in ED. GI following. - EGD did not show any acute bleeding, showed small hiatal hernia. Planning for colonoscopy tomorrow. - Post transfusion CBC was 8.1 and AM CBC was 8.1. Stable Hgb for now. Plan: - Repeat CBC in AM - Continue protonix gtt - Maintenance IVF  #AFIB #Elevated JVP -presented with newly seen AFIB. No prior history. Rate: 100-120. Possible 2/2 relative ischemia in the setting of GI Bleed. Patient has positive JVP and hepatojugular reflex.  - Cardiac monitoring  - Will give PRNs for rate control if needed - No anticoagulation given active bleed - Ordered Echo to assess for any valvular abnormalities for possible AFIB source and possible CHF due to elevated JVP - AM BMP  #Dilated common bile duct: - Noted on abdominal ultrasound along with gallstone in the gallbladder. Also noted elevated T. Billi. - Repeat CMP for T. billi level  #Alcohol use: -Patient drinks about 4-6 drinks everyday. AST elevated significant for alcohol  use. -Repeat CMP to monitor LFTs  #Psoriatic Arthritis: - Patient takes 2-4 tablets of 200 mg of ibuprofen twice a day for pain control. - Discontinue NSAIDs use. - Lidocaine patch and Voltaren gel PRN - Patient can take her home ointment of triamcinolone after pharmacy approves - Will need outpatient referral for Rheumatology  #PCOS: Stable  Diet: NPO DVT PPx: SCDs due to GI bleed Code status: Full code  Dispo: expected length of stay 1-2 days.    LOS: 1 day   Elsie Lincoln, Medical Student 09/12/2018, 10:58 AM   Attestation for  Student Documentation:  I personally was present and performed or re-performed the history, physical exam and medical decision-making activities of this service and have verified that the service and findings are accurately documented in the student's note with some additions/corrections.  [name], MD 09/12/2018, 5:01 PM

## 2018-09-12 NOTE — Anesthesia Preprocedure Evaluation (Addendum)
Anesthesia Evaluation  Patient identified by MRN, date of birth, ID band Patient awake    Reviewed: Allergy & Precautions, H&P , NPO status , Patient's Chart, lab work & pertinent test results  Airway Mallampati: II  TM Distance: >3 FB Neck ROM: Full    Dental no notable dental hx. (+) Teeth Intact, Dental Advisory Given   Pulmonary neg pulmonary ROS,    Pulmonary exam normal breath sounds clear to auscultation       Cardiovascular negative cardio ROS   Rhythm:Regular Rate:Normal     Neuro/Psych negative neurological ROS  negative psych ROS   GI/Hepatic negative GI ROS, Neg liver ROS,   Endo/Other  negative endocrine ROS  Renal/GU negative Renal ROS  negative genitourinary   Musculoskeletal  (+) Arthritis , Osteoarthritis,    Abdominal   Peds  Hematology  (+) Blood dyscrasia, anemia ,   Anesthesia Other Findings   Reproductive/Obstetrics negative OB ROS                            Anesthesia Physical Anesthesia Plan  ASA: II  Anesthesia Plan: MAC   Post-op Pain Management:    Induction: Intravenous  PONV Risk Score and Plan: 2 and Propofol infusion and Ondansetron  Airway Management Planned: Nasal Cannula  Additional Equipment:   Intra-op Plan:   Post-operative Plan:   Informed Consent: I have reviewed the patients History and Physical, chart, labs and discussed the procedure including the risks, benefits and alternatives for the proposed anesthesia with the patient or authorized representative who has indicated his/her understanding and acceptance.     Dental advisory given  Plan Discussed with: CRNA  Anesthesia Plan Comments:         Anesthesia Quick Evaluation

## 2018-09-12 NOTE — Interval H&P Note (Signed)
History and Physical Interval Note:  09/12/2018 10:34 AM  Hannah Collier  has presented today for surgery, with the diagnosis of upper gi bleed  The various methods of treatment have been discussed with the patient and family. After consideration of risks, benefits and other options for treatment, the patient has consented to  Procedure(s): ESOPHAGOGASTRODUODENOSCOPY (EGD) WITH PROPOFOL (N/A) as a surgical intervention .  The patient's history has been reviewed, patient examined, no change in status, stable for surgery.  I have reviewed the patient's chart and labs.  Questions were answered to the patient's satisfaction.     Pricilla Riffle. Fuller Plan

## 2018-09-13 ENCOUNTER — Encounter (HOSPITAL_COMMUNITY): Payer: Self-pay | Admitting: Certified Registered Nurse Anesthetist

## 2018-09-13 ENCOUNTER — Inpatient Hospital Stay (HOSPITAL_COMMUNITY): Payer: Medicare Other | Admitting: Anesthesiology

## 2018-09-13 ENCOUNTER — Inpatient Hospital Stay (HOSPITAL_COMMUNITY): Payer: Medicare Other

## 2018-09-13 ENCOUNTER — Encounter (HOSPITAL_COMMUNITY)
Admission: EM | Disposition: A | Discharge status: Home / Self Care | Payer: Self-pay | Source: Home / Self Care | Attending: Internal Medicine

## 2018-09-13 DIAGNOSIS — D120 Benign neoplasm of cecum: Secondary | ICD-10-CM | POA: Diagnosis present

## 2018-09-13 DIAGNOSIS — I361 Nonrheumatic tricuspid (valve) insufficiency: Secondary | ICD-10-CM

## 2018-09-13 DIAGNOSIS — D62 Acute posthemorrhagic anemia: Secondary | ICD-10-CM

## 2018-09-13 DIAGNOSIS — D509 Iron deficiency anemia, unspecified: Secondary | ICD-10-CM | POA: Diagnosis present

## 2018-09-13 DIAGNOSIS — D122 Benign neoplasm of ascending colon: Secondary | ICD-10-CM | POA: Diagnosis present

## 2018-09-13 DIAGNOSIS — K5730 Diverticulosis of large intestine without perforation or abscess without bleeding: Secondary | ICD-10-CM

## 2018-09-13 DIAGNOSIS — K635 Polyp of colon: Secondary | ICD-10-CM

## 2018-09-13 HISTORY — PX: COLONOSCOPY WITH PROPOFOL: SHX5780

## 2018-09-13 HISTORY — PX: POLYPECTOMY: SHX5525

## 2018-09-13 LAB — COMPREHENSIVE METABOLIC PANEL
ALT: 43 U/L (ref 0–44)
AST: 108 U/L — ABNORMAL HIGH (ref 15–41)
Albumin: 3.8 g/dL (ref 3.5–5.0)
Alkaline Phosphatase: 117 U/L (ref 38–126)
Anion gap: 11 (ref 5–15)
BUN: 7 mg/dL — ABNORMAL LOW (ref 8–23)
CO2: 23 mmol/L (ref 22–32)
Calcium: 9.3 mg/dL (ref 8.9–10.3)
Chloride: 99 mmol/L (ref 98–111)
Creatinine, Ser: 1.01 mg/dL — ABNORMAL HIGH (ref 0.44–1.00)
GFR calc Af Amer: >60 mL/min (ref >60)
GFR calc non Af Amer: 56 mL/min — ABNORMAL LOW (ref >60)
Glucose, Bld: 121 mg/dL — ABNORMAL HIGH (ref 70–99)
Potassium: 3.6 mmol/L (ref 3.5–5.1)
Sodium: 133 mmol/L — ABNORMAL LOW (ref 135–145)
Total Bilirubin: 2.2 mg/dL — ABNORMAL HIGH (ref 0.3–1.2)
Total Protein: 7.3 g/dL (ref 6.5–8.1)

## 2018-09-13 LAB — IRON AND TIBC
Iron: 16 ug/dL — ABNORMAL LOW (ref 28–170)
Saturation Ratios: 4 % — ABNORMAL LOW (ref 10.4–31.8)
TIBC: 367 ug/dL (ref 250–450)
UIBC: 351 ug/dL

## 2018-09-13 LAB — CBC
HCT: 28.9 % — ABNORMAL LOW (ref 36.0–46.0)
Hemoglobin: 8.8 g/dL — ABNORMAL LOW (ref 12.0–15.0)
MCH: 23.7 pg — ABNORMAL LOW (ref 26.0–34.0)
MCHC: 30.4 g/dL (ref 30.0–36.0)
MCV: 77.9 fL — ABNORMAL LOW (ref 80.0–100.0)
Platelets: 114 10*3/uL — ABNORMAL LOW (ref 150–400)
RBC: 3.71 MIL/uL — ABNORMAL LOW (ref 3.87–5.11)
RDW: 23.7 % — ABNORMAL HIGH (ref 11.5–15.5)
WBC: 4.2 10*3/uL (ref 4.0–10.5)
nRBC: 0 % (ref 0.0–0.2)

## 2018-09-13 LAB — FERRITIN: Ferritin: 22 ng/mL (ref 11–307)

## 2018-09-13 LAB — ECHOCARDIOGRAM COMPLETE
Height: 68 in
Weight: 3280.44 oz

## 2018-09-13 LAB — BRAIN NATRIURETIC PEPTIDE: B Natriuretic Peptide: 580 pg/mL — ABNORMAL HIGH (ref 0.0–100.0)

## 2018-09-13 SURGERY — COLONOSCOPY WITH PROPOFOL
Anesthesia: Monitor Anesthesia Care

## 2018-09-13 MED ORDER — PROPOFOL 500 MG/50ML IV EMUL
INTRAVENOUS | Status: DC | PRN
  Administered 2018-09-13: 75 ug/kg/min via INTRAVENOUS

## 2018-09-13 MED ORDER — LIDOCAINE 5 % EX PTCH: 1.0000 | MEDICATED_PATCH | CUTANEOUS | 0 refills | Status: DC | PRN

## 2018-09-13 MED ORDER — DICLOFENAC SODIUM 1 % TD GEL: 2.0000 g | Freq: Four times a day (QID) | TRANSDERMAL | 0 refills | Status: DC

## 2018-09-13 MED ORDER — PROPOFOL 10 MG/ML IV BOLUS
INTRAVENOUS | Status: DC | PRN
  Administered 2018-09-13 (×2): 20 mg via INTRAVENOUS

## 2018-09-13 MED ORDER — PANTOPRAZOLE SODIUM 40 MG PO TBEC: 40.0000 mg | DELAYED_RELEASE_TABLET | Freq: Every day | ORAL | 0 refills | Status: DC

## 2018-09-13 MED ORDER — SODIUM CHLORIDE 0.9 % IV SOLN
510.0000 mg | Freq: Once | INTRAVENOUS | Status: AC
  Administered 2018-09-13: 510 mg via INTRAVENOUS
  Filled 2018-09-13: qty 17

## 2018-09-13 MED ORDER — LIDOCAINE 2% (20 MG/ML) 5 ML SYRINGE
INTRAMUSCULAR | Status: DC | PRN
  Administered 2018-09-13: 100 mg via INTRAVENOUS

## 2018-09-13 MED ORDER — LACTATED RINGERS IV SOLN
INTRAVENOUS | Status: DC
  Administered 2018-09-13: 08:00:00 via INTRAVENOUS

## 2018-09-13 SURGICAL SUPPLY — 21 items

## 2018-09-13 NOTE — Anesthesia Procedure Notes (Signed)
Procedure Name: MAC Date/Time: 09/13/2018 8:06 AM Performed by: Candis Shine, CRNA Pre-anesthesia Checklist: Patient identified, Emergency Drugs available, Suction available, Patient being monitored and Timeout performed Patient Re-evaluated:Patient Re-evaluated prior to induction Oxygen Delivery Method: Simple face mask Dental Injury: Teeth and Oropharynx as per pre-operative assessment

## 2018-09-13 NOTE — Anesthesia Postprocedure Evaluation (Signed)
Anesthesia Post Note  Patient: Hannah Collier  Procedure(s) Performed: ESOPHAGOGASTRODUODENOSCOPY (EGD) WITH PROPOFOL (N/A )     Patient location during evaluation: Endoscopy Anesthesia Type: MAC Level of consciousness: awake and alert Pain management: pain level controlled Vital Signs Assessment: post-procedure vital signs reviewed and stable Respiratory status: spontaneous breathing, nonlabored ventilation, respiratory function stable and patient connected to nasal cannula oxygen Cardiovascular status: blood pressure returned to baseline and stable Postop Assessment: no apparent nausea or vomiting Anesthetic complications: no    Last Vitals:  Vitals:   09/12/18 2217 09/13/18 0425  BP: (!) 156/95 (!) 158/102  Pulse: (!) 102 (!) 102  Resp:  18  Temp: 36.8 C 36.5 C  SpO2: 95% 95%    Last Pain:  Vitals:   09/13/18 0425  TempSrc: Oral  PainSc:                  Haakon S

## 2018-09-13 NOTE — Transfer of Care (Signed)
Immediate Anesthesia Transfer of Care Note  Patient: Hannah Collier  Procedure(s) Performed: COLONOSCOPY WITH PROPOFOL (N/A ) POLYPECTOMY  Patient Location: Endoscopy Unit  Anesthesia Type:MAC  Level of Consciousness: awake, alert  and oriented  Airway & Oxygen Therapy: Patient Spontanous Breathing and Patient connected to face mask oxygen  Post-op Assessment: Report given to RN and Post -op Vital signs reviewed and stable  Post vital signs: Reviewed and stable  Last Vitals:  Vitals Value Taken Time  BP 117/71 09/13/2018  8:45 AM  Temp    Pulse 81 09/13/2018  8:45 AM  Resp 18 09/13/2018  8:45 AM  SpO2 98 % 09/13/2018  8:45 AM  Vitals shown include unvalidated device data.  Last Pain:  Vitals:   09/13/18 0727  TempSrc: Oral  PainSc: 0-No pain         Complications: No apparent anesthesia complications

## 2018-09-13 NOTE — Interval H&P Note (Signed)
History and Physical Interval Note:  09/13/2018 8:03 AM  Hannah Collier  has presented today for surgery, with the diagnosis of melena  The various methods of treatment have been discussed with the patient and family. After consideration of risks, benefits and other options for treatment, the patient has consented to  Procedure(s): COLONOSCOPY WITH PROPOFOL (N/A) as a surgical intervention .  The patient's history has been reviewed, patient examined, no change in status, stable for surgery.  I have reviewed the patient's chart and labs.  Questions were answered to the patient's satisfaction.     Pricilla Riffle. Fuller Plan

## 2018-09-13 NOTE — Progress Notes (Signed)
  Echocardiogram 2D Echocardiogram has been performed.  Hannah Collier 09/13/2018, 2:53 PM

## 2018-09-13 NOTE — Op Note (Addendum)
[facility] Patient Name: Hannah Collier Procedure Date : 09/13/2018 MRN: [id number] Attending MD: Ladene Artist , MD Date of Birth: [birth year] CSN: [id number] Age: 73 Admit Type: Inpatient Procedure:                Colonoscopy Indications:              Melena, Iron deficiency anemia Providers:                Pricilla Riffle. Fuller Plan, MD, Carlyn Reichert, RN, Dorise Hiss, RN, Cherylynn Ridges, Technician, Dellie Catholic, CRNA Referring MD:             Lenice Pressman, MD Medicines:                Monitored Anesthesia Care Complications:            No immediate complications. Estimated blood loss:                            None. Estimated Blood Loss:     Estimated blood loss: none. Procedure:                Pre-Anesthesia Assessment:                           - Prior to the procedure, a History and Physical                            was performed, and patient medications and                            allergies were reviewed. The patient's tolerance of                            previous anesthesia was also reviewed. The risks                            and benefits of the procedure and the sedation                            options and risks were discussed with the patient.                            All questions were answered, and informed consent                            was obtained. Prior Anticoagulants: The patient has                            taken no previous anticoagulant or antiplatelet                            agents. ASA  Grade Assessment: II - A patient with                            mild systemic disease. After reviewing the risks                            and benefits, the patient was deemed in                            satisfactory condition to undergo the procedure.                           After obtaining informed consent, the colonoscope                            was passed under direct vision.  Throughout the                            procedure, the patient's blood pressure, pulse, and                            oxygen saturations were monitored continuously. The                            [id number]-H190DL ([id number] Olympus pediatric colonoscope                            was introduced through the anus and advanced to the                            the cecum, identified by appendiceal orifice and                            ileocecal valve. The ileocecal valve, appendiceal                            orifice, and rectum were photographed. The quality                            of the bowel preparation was good. The colonoscopy                            was performed without difficulty. The patient                            tolerated the procedure well. Scope In: 8:13:25 AM Scope Out: 8:39:13 AM Scope Withdrawal Time: 0 hours 22 minutes 8 seconds  Total Procedure Duration: 0 hours 25 minutes 48 seconds  Findings:      The perianal and digital rectal examinations were normal.      Three sessile polyps were found in the ascending colon (2) and cecum       (1). The polyps were 7, 10 and 14 mm in size. These polyps were removed       with a hot snare.  Resection and retrieval were complete. The 7 mm polyp       was initially removed with cold snare however with persistent oozing at       the site a small amount of cautery was used with the snare tip at the       site with hemostatis acheived.      Multiple medium-mouthed diverticula were found in the left colon. There       was narrowing of the colon in association with the diverticular opening.       There was evidence of diverticular spasm. There was no evidence of       diverticular bleeding.      The exam was otherwise without abnormality on direct and retroflexion       views. No blood in colon. Impression:               - Three 7 to 14 mm polyps in the ascending colon                            and in the cecum, removed with a hot  snare.                            Resected and retrieved.                           - Moderate diverticulosis in the left colon. There                            was narrowing of the colon in association with the                            diverticular opening. There was evidence of                            diverticular spasm. There was no evidence of                            diverticular bleeding.                           - The examination was otherwise normal on direct                            and retroflexion views. Recommendation:           - Presumed diverticular bleed which has resolved.                           - Repeat colonoscopy in 3 years for surveillance,                            pending pathology review, with Dr. Havery Moros.                           - Patient has a contact number available for  emergencies. The signs and symptoms of potential                            delayed complications were discussed with the                            patient. Return to normal activities tomorrow.                            Written discharge instructions were provided to the                            patient.                           - High fiber diet.                           - Continue present medications.                           - Await pathology results.                           - No aspirin, ibuprofen, naproxen, or other                            non-steroidal anti-inflammatory drugs for 2 weeks                            after polyp removal.                           - Discontinue etoh use.                           - Iron replacement per primary service.                           - Advance diet and if stable OK for discharge later                            today from a GI standpoint.                           - GI follow with Dr. Jolly Mango in 1 month                            for follow up of GI bleed, anemia and for further                             evaluation of elevated LFTs - steatosis and etoh                            use are likely causes.                           -  GI signing off. Procedure Code(s):        --- Professional ---                           [phone], Colonoscopy, flexible; with removal of                            tumor(s), polyp(s), or other lesion(s) by snare                            technique Diagnosis Code(s):        --- Professional ---                           D12.2, Benign neoplasm of ascending colon                           D12.0, Benign neoplasm of cecum                           K92.1, Melena (includes Hematochezia)                           D50.9, Iron deficiency anemia, unspecified                           K57.30, Diverticulosis of large intestine without                            perforation or abscess without bleeding CPT copyright 2018 [employer]. All rights reserved. The codes documented in this report are preliminary and upon coder review may  be revised to meet current compliance requirements. Ladene Artist, MD 09/13/2018 8:52:28 AM This report has been signed electronically. Number of Addenda: 0

## 2018-09-13 NOTE — Discharge Instructions (Signed)

## 2018-09-13 NOTE — Discharge Summary (Signed)
Name: Hannah Collier MRN: [id number] DOB: 02-09-1946 73 y.o. PCP: Patient, No Pcp Per  Date of Admission: 09/11/2018  9:56 AM Date of Discharge: 09/13/2018 Attending Physician: Oda Kilts, MD  Discharge Diagnosis: 1. Symptomatic anemia 2/2 diverticular bleeding 2. AFIB 3. Dilated common bile duct 4. Alcohol use 5. Psoriatic arthritis  Discharge Medications: Allergies as of 09/13/2018      Reactions   Sulfa Antibiotics Rash      Medication List    STOP taking these medications   ibuprofen 200 MG tablet Commonly known as:  ADVIL,MOTRIN     TAKE these medications   diclofenac sodium 1 % Gel Commonly known as:  VOLTAREN Apply 2 g topically 4 (four) times daily.   KLS ALLER-TEC 10 MG tablet Generic drug:  cetirizine Take 10 mg by mouth daily.   lidocaine 5 % Commonly known as:  LIDODERM Place 1 patch onto the skin as needed. Remove & Discard patch within 12 hours or as directed by MD   pantoprazole 40 MG tablet Commonly known as:  PROTONIX Take 1 tablet (40 mg total) by mouth daily at 6 (six) AM. Start taking on:  September 14, 2018   traZODone 50 MG tablet Commonly known as:  DESYREL Take 0.5 tablets (25 mg total) by mouth at bedtime.   triamcinolone cream 0.1 % Commonly known as:  KENALOG Apply 1 application topically 2 (two) times daily. What changed:  Another medication with the same name was removed. Continue taking this medication, and follow the directions you see here.       Disposition and follow-up:   Hannah Collier was discharged from [facility] in Stable condition.  At the hospital follow up visit please address:  1.  GI Bleed - Believed to be 2/2 self limited diverticular bleed - Ensure cessation of NSAIDs and ETOH - Ensure low fiber diet, consider nutrition referral - Continue PPI for 1 month - Repeat CBC and Iron level  2. A-fib, New - In the setting of GI bleed and sever TR - Rate controlled w/o  medication - Screen for symptoms - Repeat EKG to eval for persistence - Consider starting anticoagulation - Consider Cardiology referral as she may be a candidate for cardioversion  3. Psoriatic Arthritis - Pain of back, hands, and feet - Evaluate effectiveness of Lidocaine patch and Voltaren gel - Rheumatology referral   Other: > Patient mentions anxiety that she was previously prescribed a benzo for. She was informed that this would not be recommended for her. - Consider SSRI or other medication as appropriate  > Sessile polyps on Colonoscopy. Path Pending. Repeat in 3 years.  2.  Labs / imaging needed at time of follow-up: CBC, Fe/Ferritin, EKG  3.  Pending labs/ test needing follow-up: Polyp Pathology  Follow-up Appointments: Follow up with internal medicine clinic in 1 week; Wednesday, Feb 26 at [place] by problem list:  Symptomatic anemia 2/2 diverticular bleeding: Presented with a couple weeks of fatigue and weakness and 2 days of dark stools. On admission, her Hgb was noted to be 6.7. She received IVF, PPI, and 2U pRBCs. GI performed EGD without signs of active bleeding and a colonoscopy showing sessile polyps, bleeding diverticula and diverticular spasm. The polyps were removed and the bleeding was stopped. Hgb stable at 8.8 on Discharge. Patient advised to stop her chronic NSAIDs and alcohol use and to begin high fiber diet.  #AFIB # Elevated JVP: Presented in AFIB, no prior history of  this. Possibly due to the relative ischemia in the setting of the GI bleed combined with TR on echo. Rate remained controlled in the 100-110s without medication.Anticoagulation deferred due to GI bleed. Echo showed EF of 50-55% with severely dilated left and right atriums and mod-severe tricuspid regurgitation. Anticoagulation and possible cardiology referral to be addressed at follow up.  #Dilated common bile duct: On abdominal ultrasound done in the ED it was noted the patient  had mildly dilated CBD and gallstones in the gallbladder. The patient remained asymptomatic and T-Bili improve while admitted.  # Alcohol use: Patient admits to drinking about 4-6 drinks every evening to cope with stress in her life. She presented with elevated AST that is trending down. Patient was informed on risks of her continued alcohol use and agrees with the need for cessation.   # Psoriatic arthritis: Patient usually takes about 4-6 ibuprofens daily for pain control. She was informed on the risk of NSAIDs and GI bleeding. All NSAID use was discontinued and the patient was given lidocaine patch and voltaren gel PRN which the patient has noted has improved her pain. Patient will discontinue her NSAID use at home and continue with the same pain regimen as in the hospital. A Rheumatology referral is recommended for further management of her psoriatic arthritis.  Discharge Vitals:   BP (!) 154/99   Pulse 84   Temp 98.1 F (36.7 C) (Oral)   Resp 19   Ht 5\' 8"  (1.727 m)   Wt 93 kg   SpO2 100%   BMI 31.17 kg/m   Pertinent Labs, Studies, and Procedures:  CBC Latest Ref Rng & Units 09/13/2018 09/12/2018 09/11/2018  WBC 4.0 - 10.5 K/uL 4.2 3.8(L) -  Hemoglobin 12.0 - 15.0 g/dL 8.8(L) 8.1(L) 8.1(L)  Hematocrit 36.0 - 46.0 % 28.9(L) 26.5(L) 25.9(L)  Platelets 150 - 400 K/uL 114(L) 101(L) -   BMP Latest Ref Rng & Units 09/13/2018 09/12/2018 09/11/2018  Glucose 70 - 99 mg/dL 121(H) 112(H) 111(H)  BUN 8 - 23 mg/dL 7(L) 10 15  Creatinine 0.44 - 1.00 mg/dL 1.01(H) 0.92 0.87  Sodium 135 - 145 mmol/L 133(L) 132(L) 129(L)  Potassium 3.5 - 5.1 mmol/L 3.6 3.8 3.8  Chloride 98 - 111 mmol/L 99 99 93(L)  CO2 22 - 32 mmol/L 23 22 22   Calcium 8.9 - 10.3 mg/dL 9.3 9.1 10.5(H)   Iron/TIBC/Ferritin/ %Sat    Component Value Date/Time   IRON 16 (L) 09/13/2018 0442   TIBC 367 09/13/2018 0442   FERRITIN 22 09/13/2018 0442   IRONPCTSAT 4 (L) 09/13/2018 0442    EKG Interpretation  Date/Time:  Sunday  September 11 2018 10:07:25 EST Ventricular Rate:  118 PR Interval:    QRS Duration: 100 QT Interval:  342 QTC Calculation: 480 R Axis:   -169 Text Interpretation:  Atrial fibrillation Right ventricular hypertrophy Inferior infarct, old Anterolateral infarct, age indeterminate Baseline wander in lead(s) V4 No previous ECGs available Confirmed by Gareth Morgan [phone]) on 09/11/2018 10:19:19 AM Also confirmed by Gareth Morgan [id number]), editor Radene Gunning [phone])  on 09/11/2018 12:48:09 PM      EGD:  Impression: - Mild, 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 - Gastric bypass with a normal-sized pouch and intact staple line. Gastrojejunal anastomosis characterized by healthy appearing mucosa and visible sutures. - Small hiatal hernia. - Normal examined jejunum. - No specimens collected. Recommendations: - Return patient to hospital ward for ongoing care. - Clear liquid diet today. - Continue present medications except change pantoprazole to 40 mg  PO qam and discontinue octreotide (no evidence of cirrhosis or portal HTN on RUQ [place], EGD). - Trend CBC. - No aspirin, ibuprofen, naproxen, or other non-steroidal anti-inflammatory drugs. - Discontinue etoh use. - Presumed iron deficiency leading to microcytosis. Recommend iron replacement after colonoscopy completed. - Perform a colonoscopy tomorrow.  Colonoscopy:  Impression: - Three 7 to 14 mm polyps in the ascending colon and in the cecum, removed with a hot snare. Resected and retrieved. - Moderate diverticulosis in the left colon. There was narrowing of the colon in association with the diverticular opening. There was evidence of diverticular spasm. There was no evidence of diverticular bleeding. - The examination was otherwise normal on direct and retroflexion views. Recommendations: - Presumed diverticular bleed which has resolved. - Repeat colonoscopy in 3 years for surveillance, pending pathology review, with Dr.  Havery Moros. - Patient has a contact number available for emergencies. The signs and symptoms of potential delayed complications were discussed with the patient. Return to normal activities tomorrow. Written discharge instructions were provided to the patient. - High fiber diet. - Continue present medications. - Await pathology results. - No aspirin, ibuprofen, naproxen, or other non-steroidal anti-inflammatory drugs for 2 weeks after polyp removal. - Discontinue etoh use. - Iron replacement per primary service. - Advance diet and if stable OK for discharge later today from a GI standpoint. - GI follow with Dr. Jolly Mango in 1 month for follow up of GI bleed, anemia and for further evaluation of elevated LFTs - steatosis and etoh use are likely causes. - GI signing off.  Echocardiogram: IMPRESSIONS  1. The left ventricle has low normal systolic function, with an ejection fraction of 50-55%. The cavity size was normal. Left ventricular diastolic Doppler parameters are consistent with restrictive filling No evidence of left ventricular regional wall  motion abnormalities.  2. The right ventricle has normal systolic function. The cavity was normal. There is no increase in right ventricular wall thickness. Right ventricular systolic pressure is moderately elevated with an estimated pressure of 45.0 mmHg.  3. Left atrial size was severely dilated.  4. Right atrial size was severely dilated.  5. The mitral valve is normal in structure.  6. The tricuspid valve is normal in structure. Tricuspid valve regurgitation is moderate-severe.  7. The aortic valve is tricuspid Aortic valve regurgitation is trivial by color flow Doppler.  8. The pulmonic valve was normal in structure.  9. There is mild dilatation of the ascending aorta measuring 39 mm. 10. The inferior vena cava was dilated in size with >50% respiratory variability. 11. No evidence of left ventricular regional wall motion  abnormalities.  Limited Abd U/S: IMPRESSION: 1. Gallstones with no evidence of acute cholecystitis, but mildly dilated CBD raising the possibility of Choledocholithiasis or other distal duct obstruction. 2. Evidence of hepatic steatosis.  Discharge Instructions: Discontinue alcohol and ibuprofen use Monitor stools for anymore sign of melena Follow up with internal medicine clinic in 1 week Continue activity and start high fiber diet  Discharge Instructions    Call MD for:  difficulty breathing, headache or visual disturbances   Complete by:  As directed    Call MD for:  persistant dizziness or light-headedness   Complete by:  As directed    Call MD for:  persistant nausea and vomiting   Complete by:  As directed    Call MD for:  temperature >100.4   Complete by:  As directed    Diet - low sodium heart healthy   Complete by:  As directed    Discharge instructions   Complete by:  As directed    Thank you for allowing [place] to care for you  You were treated for a GI Bleed and Atrial Fibrillation while in the hospital - Your bleed is believed to be due to a bleeding diverticulum in you colon - Your Atrial fibrillation may have been trigger by your bleed in the setting of underlying valve disease  For your bleed: - Stop NSAID use and Alcohol consumption - Eat a high fiber diet - Continue taking protonix for at least 1 month  For your Afib - We will recheck you rhythm in our clinic and consider a referral to cardiology - If you feel a persistent, rapid heart rate, contact a physician   For your pain - We have provided a prescription for lidocaine patches for you back and Voltaren gel to try on your hands and feet - Will look into a rheumatology referral at your clinic visit  You have a follow up scheduled for Wednesday 2/26 at 3:15pm   Increase activity slowly   Complete by:  As directed       Signed: [name], MD 09/13/2018, 6:13 PM   Pager: [phone]

## 2018-09-13 NOTE — Progress Notes (Addendum)
  Date: 09/13/2018  Patient name: Hannah Collier  Medical record number: [id number]  Date of birth: 19-Sep-1945   I have seen and evaluated this patient and I have discussed the plan of care with the house staff. Please see their note for complete details. I concur with their findings with the following additions/corrections:   Colonoscopy today showed diverticulosis and 3 small polyps, which were removed.  GI suspects diverticulosis was the cause of her bleeding, no active bleeding visualized on colonoscopy.  Plan high-fiber diet and would still plan to stop all alcohol given her fatty liver disease and abnormal LFTs.  We also will continue to recommend avoidance of all NSAIDs at least in the near future.  She is getting good back pain relief with the lidocaine patch, will have to work on her hand and foot pain management going forward.  Iron studies are consistent with significant iron deficiency.  We will give her IV iron today and follow-up her anemia and iron deficiency in clinic.  She continues in A. fib today, rate has decreased down into the 80s for the most part.  I still wonder about cardiac dysfunction for her given the JVD seen on exam yesterday, although it is improved today without diuresis.  We are still awaiting the echocardiogram results which will help [place] determine neck steps for her heart care.  Lenice Pressman, M.D., Ph.D. 09/13/2018, 4:44 PM

## 2018-09-13 NOTE — Progress Notes (Signed)
Subjective: Patient was examined at bedside this AM. She states she is feeling a lot better and is pleased with her care. She had a colonoscopy this AM which showed sessile polyps, bleeding diverticula, and diverticular spasm. The bleeding was cauterized and the polyps were removed. Iron studies came back low and it was discussed with the patient about how she will be given a dose of IV iron during her stay to bring her iron numbers up. Patient is still in AFIB and we are awaiting her Echo results to see how to proceed with the AFIB and possible CHF diagnosis.   Objective:  Vital signs in last 24 hours: Vitals:   09/13/18 0845 09/13/18 0850 09/13/18 0900 09/13/18 0904  BP: 117/71 117/70 (!) 140/96 (!) 154/99  Pulse: 81 61  84  Resp: 14 (!) 24 (!) 23 19  Temp: 98.1 F (36.7 C)     TempSrc: Oral     SpO2: 98% 99% 95% 100%  Weight:      Height:       Weight change:   Intake/Output Summary (Last 24 hours) at 09/13/2018 1023 Last data filed at 09/13/2018 5956 Gross per 24 hour  Intake 1440 ml  Output 25 ml  Net 1415 ml   General: Elderly WF laying comfortably in bed in no acute distress Lungs: Clear to ausculation, no wheezing or crackles Heart: regular rate, irregularly irregular rhythm, no murmurs Extremities: trace pitting edema to mid shin, no rashes or deformities  Assessment/Plan:  Principal Problem:   GI bleed Active Problems:   New onset a-fib (HCC)   Psoriatic arthritis (HCC)   Acute blood loss anemia   Symptomatic anemia   Benign neoplasm of ascending colon   Benign neoplasm of cecum   Iron deficiency anemia  Patient is a 73 year old female who drinks 4-6 drinks everyday and has chronic daily NSAID use who presents to the ED with symptoms of fatigue and weakness that has been acutely getting worse in the last 10 days. Patient admits to being increasingly fatigued since thanksgiving but did not think anything of it until she started having melena yesterday  evening.  #Iron deficinecy #Symptomatic anemia 2/2 melena:Presented with10 daysN, Shaking, and decreased appetite, 5 days of weakness, and 2 days ofdark stools. On admission Hgb was 6.7. Patient was given 1L NaCl, IVprotonix, and 2U pRBCs in ED.  EGD did not show any acute bleeding, showed small hiatal hernia. Colonoscopy showed three small sessile polyps (snared and sent for pathology) and diverticulosis. Bleed believed to be self limited diverticular bleed. - Appreciate GI Recommendations. - Post transfusion CBC was 8.1 and AM CBC was 8.1. Stable Hgb for now. Plan: - Repeat colonoscopy in 3 years - No NSAIDs or Alcohol - Protonix 40mg  PO Daily - IV Feraheme  #AFIB #Elevated JVP Presented with new AFIB.  Rate: 100-120.Possible 2/2 relative ischemia in the setting of GI Bleed. Patient had positive JVP and hepatojugular reflex, improved this AM. BNP mildly elevated at 580. Does not appear volume overloaded.  -Cardiac monitoring -Will give PRNs for rate control if needed - Will need anticoagulation, but this will be deferred for now given recent GI bleed - Awaiting Echo results to assess for any valvular abnormalities for possible AFIB source and possible CHF due to elevated JVP - Strict I/O, Daily Weights  #Dilated common bile duct: - Noted on abdominal ultrasound along with gallstone in the gallbladder. Also noted elevated T. Billi; Trending down.  #Alcohol use: - Patient drinks  about 4-6 drinks everyday. AST elevated significant for alcohol use. AST trending down.  #Psoriatic Arthritis: - Patient took 2-4 tablets of 200 mg of ibuprofen twice a day for pain control. - Discontinue NSAIDs use - Lidocaine patch PRN - Kanalog PRN for foot pain - Will need outpatient referral for Rheumatology  #PCOS:Stable  Diet: cardiac diet DVT PPx: SCDs due to GI bleed Code status: Full code  Dispo: Expected discharge in 0-1 days, Pending echo results.   LOS: 2 days    Hannah Collier, Medical Student 09/13/2018, 10:23 AM   Attestation for Student Documentation:  I personally was present and performed or re-performed the history, physical exam and medical decision-making activities of this service and have verified that the service and findings are accurately documented in the student's note with some additions/corrections.  [name], MD 09/13/2018, 12:58 PM

## 2018-09-13 NOTE — Anesthesia Postprocedure Evaluation (Signed)
Anesthesia Post Note  Patient: Hannah Collier  Procedure(s) Performed: COLONOSCOPY WITH PROPOFOL (N/A ) POLYPECTOMY     Patient location during evaluation: Endoscopy Anesthesia Type: MAC Level of consciousness: awake and alert Pain management: pain level controlled Vital Signs Assessment: post-procedure vital signs reviewed and stable Respiratory status: spontaneous breathing, nonlabored ventilation and respiratory function stable Cardiovascular status: stable and blood pressure returned to baseline Postop Assessment: no apparent nausea or vomiting Anesthetic complications: no    Last Vitals:  Vitals:   09/13/18 0900 09/13/18 0904  BP: (!) 140/96 (!) 154/99  Pulse:  84  Resp: (!) 23 19  Temp:    SpO2: 95% 100%    Last Pain:  Vitals:   09/13/18 1009  TempSrc:   PainSc: 0-No pain                 Catalina Gravel

## 2018-09-14 ENCOUNTER — Encounter (HOSPITAL_COMMUNITY): Payer: Self-pay | Admitting: Gastroenterology

## 2018-09-14 NOTE — Consult Note (Signed)
            [facility] CM Primary Care Navigator  09/14/2018  Hannah Collier [birth year] [id number]   Attempted to see patient at the bedside to identify possible discharge needs but noted that she is unattributed in client list- has no primary care provider listed.  Patient is not currently a beneficiary of the [place]. Membership Roster ([employer]- All Payers List)  was used to verify non-eligible status.  Noted no [facility] care management needs identifiable at this point.     For additional questions please contact:  Luan Pulling, BSN, RN-BC [facility] PRIMARY CARE Navigator Cell: [phone]

## 2018-09-15 ENCOUNTER — Telehealth: Payer: Self-pay | Admitting: *Deleted

## 2018-09-15 NOTE — Telephone Encounter (Signed)
She has picked up all prescribed medications. The Trazodone was a carry over form her pre-admission med list.  Thank you, Pearson Grippe

## 2018-09-15 NOTE — Telephone Encounter (Signed)
Call from patient-she went to pharmacy and picked up Voltaren gel, pantoprazole, and lidocaine patches, but states she thought she was picking up a "fourth" rx.  States trazodone is on discharge papers.  Pt informed that since she was prescribed this medication in the past (#10 on 06/19/17) that it  might have showed up on her paperwork, but I will check with MD to make sure.. States she thought MD informed her that she would be taking two pills in addition to the patches (lidoderm) and gel (voltaren) she received at the pharmacy.   Pt will be seen as a new HFU on 09/21/18.  Will send to Dr Trilby Drummer to see if there is an additional prescription needed.  Please advise.[name]2/20/20209:56 AM

## 2018-09-15 NOTE — Telephone Encounter (Signed)
Pt called / informed she had picked up all meds per Dr Trilby Drummer and Sherlyn Lees was "from her pre-admission med list". Stated she just wanted to be sure and thanks for calling.

## 2018-09-17 ENCOUNTER — Encounter: Payer: Self-pay | Admitting: Gastroenterology

## 2018-09-21 ENCOUNTER — Ambulatory Visit (INDEPENDENT_AMBULATORY_CARE_PROVIDER_SITE_OTHER): Payer: Medicare Other | Admitting: Internal Medicine

## 2018-09-21 ENCOUNTER — Other Ambulatory Visit: Payer: Self-pay

## 2018-09-21 VITALS — BP 181/100 | HR 106 | Temp 97.5°F | Wt 208.8 lb

## 2018-09-21 DIAGNOSIS — I10 Essential (primary) hypertension: Secondary | ICD-10-CM | POA: Diagnosis not present

## 2018-09-21 DIAGNOSIS — Z9884 Bariatric surgery status: Secondary | ICD-10-CM

## 2018-09-21 DIAGNOSIS — K5790 Diverticulosis of intestine, part unspecified, without perforation or abscess without bleeding: Secondary | ICD-10-CM | POA: Diagnosis not present

## 2018-09-21 DIAGNOSIS — L4050 Arthropathic psoriasis, unspecified: Secondary | ICD-10-CM | POA: Diagnosis not present

## 2018-09-21 DIAGNOSIS — K5791 Diverticulosis of intestine, part unspecified, without perforation or abscess with bleeding: Secondary | ICD-10-CM

## 2018-09-21 DIAGNOSIS — F411 Generalized anxiety disorder: Secondary | ICD-10-CM

## 2018-09-21 DIAGNOSIS — I4891 Unspecified atrial fibrillation: Secondary | ICD-10-CM

## 2018-09-21 DIAGNOSIS — R002 Palpitations: Secondary | ICD-10-CM

## 2018-09-21 DIAGNOSIS — Z9889 Other specified postprocedural states: Secondary | ICD-10-CM

## 2018-09-21 DIAGNOSIS — Z8719 Personal history of other diseases of the digestive system: Secondary | ICD-10-CM | POA: Diagnosis not present

## 2018-09-21 DIAGNOSIS — R7401 Elevation of levels of liver transaminase levels: Secondary | ICD-10-CM

## 2018-09-21 DIAGNOSIS — Z79899 Other long term (current) drug therapy: Secondary | ICD-10-CM

## 2018-09-21 DIAGNOSIS — R740 Nonspecific elevation of levels of transaminase and lactic acid dehydrogenase [LDH]: Secondary | ICD-10-CM

## 2018-09-21 MED ORDER — FLUOXETINE HCL 10 MG PO CAPS: 10.0000 mg | ORAL_CAPSULE | Freq: Every day | ORAL | 1 refills | Status: DC

## 2018-09-21 MED ORDER — CLONAZEPAM 0.5 MG PO TABS: 0.5000 mg | ORAL_TABLET | Freq: Every day | ORAL | 0 refills | Status: DC | PRN

## 2018-09-21 MED ORDER — METOPROLOL TARTRATE 25 MG PO TABS: 12.5000 mg | ORAL_TABLET | Freq: Two times a day (BID) | ORAL | 1 refills | Status: DC

## 2018-09-21 NOTE — Patient Instructions (Addendum)
[name],  It was a pleasure to meet you. I am sorry to hear you have not been feeling well. For your anxiety, I have referred you to meet with our clinic counselor. Please make an appointment with Dessie Coma when you check out.  I have also started you on Prozac. Please take one tablet daily. I have given you a short term prescription for clonopin while we wait for the prozac to take effect.   For your atrial fibrillation, I have started you on a medication called metoprolol. Please take 1/2 of a tablet twice a day. I have sent a referral for cardiology, you will be called to schedule.   I have also referred you to a rheumatologist. Again, you will be contacted to schedule this appointment.   Please follow up with [place] again in 1 month, or sooner if you have any issues. If you have any questions or concerns, call our clinic at [phone] or after hours call [phone] and ask for the internal medicine resident on call. Thank you!  Dr. Philipp Ovens

## 2018-09-21 NOTE — Progress Notes (Signed)
   CC: Diverticular bleed follow up  HPI:  [name] is a 73 y.o. female with past medical history outlined below here for hospital follow up for a diverticular bleed. For the details of today's visit, please refer to the assessment and plan.  Past Medical History:  Diagnosis Date  . Gastric bypass status for obesity   . Metabolic syndrome   . PCOS (polycystic ovarian syndrome)     Review of Systems  Cardiovascular: Positive for palpitations. Negative for chest pain.  Gastrointestinal: Negative for blood in stool and melena.    Physical Exam:  Vitals:   09/21/18 1537  BP: (!) 181/100  Pulse: (!) 106  Temp: (!) 97.5 F (36.4 C)  TempSrc: Oral  SpO2: 98%  Weight: 208 lb 12.8 oz (94.7 kg)    Constitutional: NAD, appears comfortable Cardiovascular: Tachycardic, irregular, no m/r/g Pulmonary/Chest: CTAB, no wheezes, rales, or rhonchi.  Abdominal: Soft, non tender, non distended. +BS.  Extremities: Warm and well perfused. No edema.  Psychiatric: Normal mood and affect  Assessment & Plan:   See Encounters Tab for problem based charting.  Patient discussed with Dr. Eppie Gibson

## 2018-09-22 ENCOUNTER — Encounter: Payer: Self-pay | Admitting: Internal Medicine

## 2018-09-22 LAB — CMP14 + ANION GAP
ALK PHOS: 99 IU/L (ref 39–117)
ALT: 20 IU/L (ref 0–32)
ANION GAP: 17 mmol/L (ref 10.0–18.0)
AST: 36 IU/L (ref 0–40)
Albumin/Globulin Ratio: 1.3 (ref 1.2–2.2)
Albumin: 3.9 g/dL (ref 3.7–4.7)
BUN/Creatinine Ratio: 13 (ref 12–28)
BUN: 10 mg/dL (ref 8–27)
Bilirubin Total: 1.7 mg/dL — ABNORMAL HIGH (ref 0.0–1.2)
CO2: 21 mmol/L (ref 20–29)
CREATININE: 0.79 mg/dL (ref 0.57–1.00)
Calcium: 9.6 mg/dL (ref 8.7–10.3)
Chloride: 101 mmol/L (ref 96–106)
GFR calc Af Amer: 86 mL/min/{1.73_m2} (ref >59)
GFR calc non Af Amer: 75 mL/min/{1.73_m2} (ref >59)
Globulin, Total: 3.1 g/dL (ref 1.5–4.5)
Glucose: 91 mg/dL (ref 65–99)
Potassium: 4.4 mmol/L (ref 3.5–5.2)
Sodium: 139 mmol/L (ref 134–144)
Total Protein: 7 g/dL (ref 6.0–8.5)

## 2018-09-22 LAB — CBC
Hematocrit: 31 % — ABNORMAL LOW (ref 34.0–46.6)
Hemoglobin: 9.5 g/dL — ABNORMAL LOW (ref 11.1–15.9)
MCH: 24.4 pg — ABNORMAL LOW (ref 26.6–33.0)
MCHC: 30.6 g/dL — ABNORMAL LOW (ref 31.5–35.7)
MCV: 80 fL (ref 79–97)
Platelets: 228 10*3/uL (ref 150–450)
RBC: 3.9 x10E6/uL (ref 3.77–5.28)
RDW: 24 % — ABNORMAL HIGH (ref 11.7–15.4)
WBC: 4.3 10*3/uL (ref 3.4–10.8)

## 2018-09-22 NOTE — Progress Notes (Signed)
Case discussed with Dr. Philipp Ovens at the time of the visit. We reviewed the resident's history and exam and pertinent patient test results. I agree with the assessment, diagnosis, and plan of care documented in the resident's note.  If she remains stable with no more evidence of a GI bleed, anticoagulation for her atrial fibrillation should be considered within the next month.

## 2018-09-22 NOTE — Assessment & Plan Note (Addendum)
Patient was recently hospitalized for GI bleed secondary to self-limited diverticular bleed.  She denies further melena since discharge.  She is compliant with her Protonix 40 mg daily and has stopped drinking alcohol.  Repeat CBC today with hemoglobin 9.5, up from 8.8 at discharge. -- Continue to monitor

## 2018-09-22 NOTE — Assessment & Plan Note (Signed)
Patient was noted to have a mild transaminitis during recent hospitalization in the setting of daily heavy alcohol use.  She was advised to stop drinking.  She has been sober since discharge, and repeat LFTs today have normalized.  Encouraged patient to continue with sobriety.

## 2018-09-22 NOTE — Assessment & Plan Note (Addendum)
Patient was diagnosed with new onset atrial fibrillation with RVR on admission, in the setting of GI bleed.  Ventricular rates improved with transfusion, did not require medications. Echocardiogram was obtained and demonstrated an EF of 48-18%, grade 3 diastolic dysfunction, no regional wall motion abnormalities, severe biatrial enlargement, moderate-severe tricuspid regurg, and moderately elevated right ventricular pressure. She remained in atrial fibrillation at the time of discharge.  She was not started on anticoagulation due to her GI bleed.  She is persistently in A. fib today and mildly tachycardic.  She is also hypertensive, blood pressure 181/100.  Will start low-dose beta-blocker and refer to cardiology. - Start metoprolol 12.5 mg twice daily -Cardiology referral (requesting Dr. Percival Spanish)

## 2018-09-22 NOTE — Assessment & Plan Note (Signed)
The majority of today's visit was spent discussing her chronic uncontrolled generalized anxiety disorder. Recently she has been self medicating with alcohol, but stopped after being informed of her liver injury during hospitalization. She first developed issues with anxiety during young adulthood while she was working a high stress job.  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  She was on Prozac many years ago but experienced side effect of sexual dysfunction and stopped the medication. She thinks it was effective for her anxiety, but was so long ago she has trouble remembering.  Patient is originally from [place].  Patient spent many years caring for her first husband after he felt ill.  After he passed away, patient remarried.  Unfortunately her second husband became ill a few years ago.  She and her second husband moved here to [facility] to be closer to his family.  She left all of her friends and family in [place] and feels very isolated here in [place].  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  During recent hospitalization, she felt that her husband was very dismissive of her health problems.  He told her that he was eager for her to come home, but after discharge she felt this was only so that she could continue to "wait on him hand-and-foot".  Prior to moving to [facility], patient was prescribed Klonopin for her anxiety.  She reports this was very effective for her and took it only as needed.  A 1 month supply would last her 3 months.  Today we spent a long time discussing treatment options for generalized anxiety disorder.  Explained that benzodiazepines are no longer used as first-line or monotherapy for GAD due to the risk of physical dependence, addiction, and central acting side effects including fall risk.  In addition, I explained that this may be  dangerous if she continues to drink alcohol.  We discussed SSRIs being a safer, long-term option.  She is no longer sexually active and is open to trying Prozac again. I explained that this medication generally take 4-6 weeks to become effective.  Due to the severity of her anxiety, she is worried that she will relapse with her alcohol use before them if her anxiety is not under control.  I have agreed to prescribe a short-term course of Klonopin only as a bridge until her Prozac becomes effective.  She is also agreed to meet with our clinic counselor. -- Start prozac 10 mg daily  -- Klonopin 0.5 mg daily PRN; #30 (1 time prescription)  -- Referral to integrated behavioral health  -- Follow up 1 month

## 2018-09-22 NOTE — Assessment & Plan Note (Signed)
Patient reports history of gastric bypass in 2005.  This was done in [place].

## 2018-09-22 NOTE — Assessment & Plan Note (Signed)
Diagnosed with psoriatic arthritis during recent hospitalization.  Rheumatology referral placed by request.

## 2018-09-23 ENCOUNTER — Encounter (HOSPITAL_COMMUNITY): Payer: Self-pay | Admitting: Neurology

## 2018-09-23 ENCOUNTER — Telehealth: Payer: Self-pay | Admitting: *Deleted

## 2018-09-23 ENCOUNTER — Other Ambulatory Visit: Payer: Self-pay

## 2018-09-23 ENCOUNTER — Inpatient Hospital Stay (HOSPITAL_COMMUNITY)
Admission: EM | Admit: 2018-09-23 | Discharge: 2018-09-25 | DRG: 378 | Disposition: A | Discharge status: Home / Self Care | Payer: Medicare Other | Attending: Internal Medicine | Admitting: Internal Medicine

## 2018-09-23 DIAGNOSIS — K633 Ulcer of intestine: Secondary | ICD-10-CM

## 2018-09-23 DIAGNOSIS — M1990 Unspecified osteoarthritis, unspecified site: Secondary | ICD-10-CM | POA: Diagnosis present

## 2018-09-23 DIAGNOSIS — I4819 Other persistent atrial fibrillation: Secondary | ICD-10-CM | POA: Diagnosis present

## 2018-09-23 DIAGNOSIS — K449 Diaphragmatic hernia without obstruction or gangrene: Secondary | ICD-10-CM | POA: Diagnosis not present

## 2018-09-23 DIAGNOSIS — Z638 Other specified problems related to primary support group: Secondary | ICD-10-CM

## 2018-09-23 DIAGNOSIS — D500 Iron deficiency anemia secondary to blood loss (chronic): Secondary | ICD-10-CM | POA: Diagnosis not present

## 2018-09-23 DIAGNOSIS — K5791 Diverticulosis of intestine, part unspecified, without perforation or abscess with bleeding: Secondary | ICD-10-CM | POA: Diagnosis not present

## 2018-09-23 DIAGNOSIS — E282 Polycystic ovarian syndrome: Secondary | ICD-10-CM | POA: Diagnosis not present

## 2018-09-23 DIAGNOSIS — Z8719 Personal history of other diseases of the digestive system: Secondary | ICD-10-CM | POA: Diagnosis not present

## 2018-09-23 DIAGNOSIS — Z9889 Other specified postprocedural states: Secondary | ICD-10-CM

## 2018-09-23 DIAGNOSIS — K5731 Diverticulosis of large intestine without perforation or abscess with bleeding: Secondary | ICD-10-CM | POA: Diagnosis not present

## 2018-09-23 DIAGNOSIS — K921 Melena: Secondary | ICD-10-CM

## 2018-09-23 DIAGNOSIS — E8881 Metabolic syndrome: Secondary | ICD-10-CM | POA: Diagnosis present

## 2018-09-23 DIAGNOSIS — Z833 Family history of diabetes mellitus: Secondary | ICD-10-CM | POA: Diagnosis not present

## 2018-09-23 DIAGNOSIS — Z9884 Bariatric surgery status: Secondary | ICD-10-CM | POA: Diagnosis not present

## 2018-09-23 DIAGNOSIS — K922 Gastrointestinal hemorrhage, unspecified: Secondary | ICD-10-CM | POA: Diagnosis not present

## 2018-09-23 DIAGNOSIS — K760 Fatty (change of) liver, not elsewhere classified: Secondary | ICD-10-CM | POA: Diagnosis not present

## 2018-09-23 DIAGNOSIS — I4891 Unspecified atrial fibrillation: Secondary | ICD-10-CM | POA: Diagnosis not present

## 2018-09-23 DIAGNOSIS — K7010 Alcoholic hepatitis without ascites: Secondary | ICD-10-CM | POA: Diagnosis not present

## 2018-09-23 DIAGNOSIS — Y838 Other surgical procedures as the cause of abnormal reaction of the patient, or of later complication, without mention of misadventure at the time of the procedure: Secondary | ICD-10-CM | POA: Diagnosis present

## 2018-09-23 DIAGNOSIS — K8020 Calculus of gallbladder without cholecystitis without obstruction: Secondary | ICD-10-CM | POA: Diagnosis present

## 2018-09-23 DIAGNOSIS — D62 Acute posthemorrhagic anemia: Secondary | ICD-10-CM | POA: Diagnosis not present

## 2018-09-23 DIAGNOSIS — K91840 Postprocedural hemorrhage and hematoma of a digestive system organ or structure following a digestive system procedure: Secondary | ICD-10-CM | POA: Diagnosis not present

## 2018-09-23 DIAGNOSIS — L4050 Arthropathic psoriasis, unspecified: Secondary | ICD-10-CM | POA: Diagnosis not present

## 2018-09-23 DIAGNOSIS — E611 Iron deficiency: Secondary | ICD-10-CM | POA: Diagnosis present

## 2018-09-23 DIAGNOSIS — I10 Essential (primary) hypertension: Secondary | ICD-10-CM | POA: Diagnosis present

## 2018-09-23 DIAGNOSIS — Z86010 Personal history of colonic polyps: Secondary | ICD-10-CM

## 2018-09-23 DIAGNOSIS — K5730 Diverticulosis of large intestine without perforation or abscess without bleeding: Secondary | ICD-10-CM | POA: Diagnosis not present

## 2018-09-23 LAB — CBC WITH DIFFERENTIAL/PLATELET
Abs Immature Granulocytes: 0.04 10*3/uL (ref 0.00–0.07)
Basophils Absolute: 0.1 10*3/uL (ref 0.0–0.1)
Basophils Relative: 2 %
Eosinophils Absolute: 0.2 10*3/uL (ref 0.0–0.5)
Eosinophils Relative: 5 %
HCT: 27 % — ABNORMAL LOW (ref 36.0–46.0)
Hemoglobin: 8.1 g/dL — ABNORMAL LOW (ref 12.0–15.0)
Immature Granulocytes: 1 %
Lymphocytes Relative: 18 %
Lymphs Abs: 0.9 10*3/uL (ref 0.7–4.0)
MCH: 24.8 pg — ABNORMAL LOW (ref 26.0–34.0)
MCHC: 30 g/dL (ref 30.0–36.0)
MCV: 82.8 fL (ref 80.0–100.0)
Monocytes Absolute: 0.5 10*3/uL (ref 0.1–1.0)
Monocytes Relative: 10 %
Neutro Abs: 3.3 10*3/uL (ref 1.7–7.7)
Neutrophils Relative %: 64 %
Platelets: 242 10*3/uL (ref 150–400)
RBC: 3.26 MIL/uL — ABNORMAL LOW (ref 3.87–5.11)
RDW: 29.4 % — ABNORMAL HIGH (ref 11.5–15.5)
WBC: 5.1 10*3/uL (ref 4.0–10.5)
nRBC: 0 % (ref 0.0–0.2)

## 2018-09-23 LAB — COMPREHENSIVE METABOLIC PANEL
ALT: 21 U/L (ref 0–44)
AST: 41 U/L (ref 15–41)
Albumin: 3.3 g/dL — ABNORMAL LOW (ref 3.5–5.0)
Alkaline Phosphatase: 67 U/L (ref 38–126)
Anion gap: 10 (ref 5–15)
BUN: 9 mg/dL (ref 8–23)
CO2: 23 mmol/L (ref 22–32)
Calcium: 8.9 mg/dL (ref 8.9–10.3)
Chloride: 102 mmol/L (ref 98–111)
Creatinine, Ser: 0.98 mg/dL (ref 0.44–1.00)
GFR calc Af Amer: >60 mL/min (ref >60)
GFR calc non Af Amer: 58 mL/min — ABNORMAL LOW (ref >60)
Glucose, Bld: 95 mg/dL (ref 70–99)
Potassium: 3.9 mmol/L (ref 3.5–5.1)
Sodium: 135 mmol/L (ref 135–145)
Total Bilirubin: 1.8 mg/dL — ABNORMAL HIGH (ref 0.3–1.2)
Total Protein: 6.4 g/dL — ABNORMAL LOW (ref 6.5–8.1)

## 2018-09-23 LAB — CBC
HCT: 20.1 % — ABNORMAL LOW (ref 36.0–46.0)
HEMOGLOBIN: 6.2 g/dL — AB (ref 12.0–15.0)
MCH: 24.9 pg — ABNORMAL LOW (ref 26.0–34.0)
MCHC: 30.8 g/dL (ref 30.0–36.0)
MCV: 80.7 fL (ref 80.0–100.0)
Platelets: 186 10*3/uL (ref 150–400)
RBC: 2.49 MIL/uL — ABNORMAL LOW (ref 3.87–5.11)
RDW: 29 % — ABNORMAL HIGH (ref 11.5–15.5)
WBC: 4.6 10*3/uL (ref 4.0–10.5)
nRBC: 0 % (ref 0.0–0.2)

## 2018-09-23 LAB — PREPARE RBC (CROSSMATCH)

## 2018-09-23 LAB — POC OCCULT BLOOD, ED: Fecal Occult Bld: POSITIVE — AB

## 2018-09-23 MED ORDER — CLONAZEPAM 0.5 MG PO TABS: 0.5000 mg | ORAL_TABLET | Freq: Every day | ORAL | Status: DC | PRN

## 2018-09-23 MED ORDER — TRIAMCINOLONE ACETONIDE 0.1 % EX CREA
1.0000 "application " | TOPICAL_CREAM | Freq: Two times a day (BID) | CUTANEOUS | Status: DC
  Administered 2018-09-25: 1 via TOPICAL
  Filled 2018-09-23: qty 15

## 2018-09-23 MED ORDER — DICLOFENAC SODIUM 1 % TD GEL
2.0000 g | Freq: Four times a day (QID) | TRANSDERMAL | Status: DC
  Administered 2018-09-24 – 2018-09-25 (×3): 2 g via TOPICAL
  Filled 2018-09-23: qty 100

## 2018-09-23 MED ORDER — ACETAMINOPHEN 650 MG RE SUPP: 650.0000 mg | Freq: Four times a day (QID) | RECTAL | Status: DC | PRN

## 2018-09-23 MED ORDER — PEG-KCL-NACL-NASULF-NA ASC-C 100 G PO SOLR: 0.5000 | Freq: Once | ORAL | Status: DC

## 2018-09-23 MED ORDER — PEG-KCL-NACL-NASULF-NA ASC-C 100 G PO SOLR
0.5000 | Freq: Once | ORAL | Status: DC
  Filled 2018-09-23: qty 1

## 2018-09-23 MED ORDER — LORATADINE 10 MG PO TABS
10.0000 mg | ORAL_TABLET | Freq: Every day | ORAL | Status: DC
  Administered 2018-09-24 – 2018-09-25 (×2): 10 mg via ORAL
  Filled 2018-09-23 (×2): qty 1

## 2018-09-23 MED ORDER — SODIUM CHLORIDE 0.9% FLUSH
3.0000 mL | Freq: Two times a day (BID) | INTRAVENOUS | Status: DC
  Administered 2018-09-24: 3 mL via INTRAVENOUS

## 2018-09-23 MED ORDER — PEG 3350-KCL-NA BICARB-NACL 420 G PO SOLR
4000.0000 mL | Freq: Once | ORAL | Status: AC
  Administered 2018-09-23: 4000 mL via ORAL
  Filled 2018-09-23: qty 4000

## 2018-09-23 MED ORDER — PEG-KCL-NACL-NASULF-NA ASC-C 100 G PO SOLR: 1.0000 | Freq: Once | ORAL | Status: DC

## 2018-09-23 MED ORDER — SODIUM CHLORIDE 0.9 % IV SOLN
INTRAVENOUS | Status: DC
  Administered 2018-09-23 – 2018-09-24 (×2): via INTRAVENOUS

## 2018-09-23 MED ORDER — SODIUM CHLORIDE 0.9% IV SOLUTION: Freq: Once | INTRAVENOUS | Status: DC

## 2018-09-23 MED ORDER — POLYETHYLENE GLYCOL 3350 17 GM/SCOOP PO POWD
1.0000 | Freq: Once | ORAL | Status: AC
  Administered 2018-09-23: 255 g via ORAL
  Filled 2018-09-23: qty 255

## 2018-09-23 MED ORDER — SODIUM CHLORIDE 0.9 % IV SOLN
80.0000 mg | Freq: Once | INTRAVENOUS | Status: AC
  Administered 2018-09-23: 80 mg via INTRAVENOUS
  Filled 2018-09-23: qty 80

## 2018-09-23 MED ORDER — ACETAMINOPHEN 325 MG PO TABS: 650.0000 mg | ORAL_TABLET | Freq: Four times a day (QID) | ORAL | Status: DC | PRN

## 2018-09-23 MED ORDER — PEG-KCL-NACL-NASULF-NA ASC-C 100 G PO SOLR
0.5000 | Freq: Once | ORAL | Status: DC
  Filled 2018-09-23 (×2): qty 1

## 2018-09-23 MED ORDER — BISACODYL 5 MG PO TBEC
10.0000 mg | DELAYED_RELEASE_TABLET | Freq: Once | ORAL | Status: AC
  Administered 2018-09-23: 10 mg via ORAL
  Filled 2018-09-23: qty 2

## 2018-09-23 MED ORDER — LIDOCAINE 5 % EX PTCH: 1.0000 | MEDICATED_PATCH | Freq: Two times a day (BID) | CUTANEOUS | Status: DC | PRN

## 2018-09-23 MED ORDER — FLUOXETINE HCL 10 MG PO CAPS
10.0000 mg | ORAL_CAPSULE | Freq: Every day | ORAL | Status: DC
  Administered 2018-09-24 – 2018-09-25 (×2): 10 mg via ORAL
  Filled 2018-09-23 (×2): qty 1

## 2018-09-23 NOTE — Telephone Encounter (Signed)
Per Epic, pt is currently in the ER.

## 2018-09-23 NOTE — H&P (Addendum)
Date: 09/23/2018               Patient Name:  Hannah Collier MRN: [id number]  DOB: 04-20-1946 Age / Sex: 73 y.o., female   PCP: Patient, No Pcp Per         Medical Service: Internal Medicine Teaching Service         Attending Physician: Dr. Sid Falcon, MD    First Contact: Dr. Gilberto Better Pager: [phone]  Second Contact: Dr. Kathi Ludwig Pager: [phone]       After Hours (After 5p/  First Contact Pager: [phone]  weekends / holidays): Second Contact Pager: [phone]   Chief Complaint: Bloody stool  History of Present Illness:  Hannah Collier is a 73 yo F w/ PMH of Roux-en-y gastric bypass, PCOS, A.fib, diverticulosis and psoriatic arthritis presenting with bright red blood per rectum. She had a recent admission for the same complaints diagnosed with presumed diverticular bleed after no positive findings on EGD and colonoscopy. She was discharged home after her 2 units of pRBCs and her hemoglobin stabilized. Since discharge, she has been avoiding all alcohol consumption and NSAID use. She had a follow up visit with [facility] clinic on 09/21/18 where she was found to have hemoglobin of 9.5. She was in her usual state of health until 1am last night when she had a normal bowel movement and then shortly after (30 mins) had black, tarry stool. Couple hours later around 4am she had another bowel movement with significant BRBPR. She described it as 2.5 times worse than her symptoms during her last admission. She called the clinic number and was told that she should go to urgent care or ED for evaluation and came to the ED. She denies any significant nausea, vomiting, abdominal pain, fatigue, dyspnea, palpitations.   In the ED, she was found to have hemoglobin of 8.1 and IM was consulted for admission.  Meds:  Current Meds  Medication Sig  . cetirizine (KLS ALLER-TEC) 10 MG tablet Take 10 mg by mouth daily.  . clonazePAM (KLONOPIN) 0.5 MG tablet Take 1 tablet (0.5 mg total) by mouth daily  as needed for anxiety.  . diclofenac sodium (VOLTAREN) 1 % GEL Apply 2 g topically 4 (four) times daily.  Marland Kitchen FLUoxetine (PROZAC) 10 MG capsule Take 1 capsule (10 mg total) by mouth daily.  Marland Kitchen lidocaine (LIDODERM) 5 % Place 1 patch onto the skin as needed. Remove & Discard patch within 12 hours or as directed by MD  . metoprolol tartrate (LOPRESSOR) 25 MG tablet Take 0.5 tablets (12.5 mg total) by mouth 2 (two) times daily.  . pantoprazole (PROTONIX) 40 MG tablet Take 1 tablet (40 mg total) by mouth daily at 6 (six) AM.  . triamcinolone cream (KENALOG) 0.1 % Apply 1 application topically 2 (two) times daily.  . [DISCONTINUED] traZODone (DESYREL) 50 MG tablet Take 0.5 tablets (25 mg total) by mouth at bedtime.   Allergies: Allergies as of 09/23/2018 - Review Complete 09/23/2018  Allergen Reaction Noted  . Sulfa antibiotics Rash 06/19/2017   Past Medical History:  Diagnosis Date  . Gastric bypass status for obesity   . Metabolic syndrome   . PCOS (polycystic ovarian syndrome)    Family History:  Hx of Diabetes on mother's side  Social History:  Lives with husband. Used to work as No drinks since discharge Social History   Tobacco Use  . Smoking status: Never Smoker  . Smokeless tobacco: Never Used  Substance Use Topics  .  Alcohol use: Yes  . Drug use: Not on file   Review of Systems: A complete ROS was negative except as per HPI.  Physical Exam: Blood pressure 136/78, pulse 74, temperature 97.7 F (36.5 C), temperature source Oral, resp. rate 17, SpO2 99 %. Physical Exam  Constitutional: She is oriented to person, place, and time and well-developed, well-nourished, and in no distress. No distress.  HENT:  Mouth/Throat: Oropharynx is clear and moist. No oropharyngeal exudate.  Eyes: Conjunctivae and EOM are normal. Scleral icterus is present.  Neck: Normal range of motion. Neck supple. No JVD present.  Cardiovascular: Normal rate, regular rhythm, normal heart sounds and intact  distal pulses.  No murmur heard. Pulmonary/Chest: Effort normal and breath sounds normal. She has no wheezes. She has no rales.  Abdominal: Soft. Bowel sounds are normal. There is no abdominal tenderness. There is no guarding.  Musculoskeletal: Normal range of motion.        General: No edema.  Neurological: She is alert and oriented to person, place, and time. GCS score is 15.  Skin: Skin is warm and dry. She is not diaphoretic. There is pallor.   EKG: personally reviewed my interpretation is low amplitude EKG, irregularly irregular, no ST changes  CXR: N/A  Assessment & Plan by Problem: Active Problems:   GI bleed  Hannah Collier is a 73 yo F w/ PMH of Roux-en-y gastric bypass, PCOS, A.fib, diverticulosis and psoriatic arthritis admit for GI bleed. She has known diverticulosis and is presumed to be having recurrent diverticular bleed but small bowels were not assessed. No further bowel movement since early this morning. Will admit to inpatient and monitor hgb for acute hemorrhage.  BRBPR 2/2 presumed diverticular bleed Prior hx of bleed. Current hgb 8.1 Appear pale on exam. BP and O2 sat stable. EGD and colonoscopy during last admission without active bleeding but evidence of diverticulosis. - GI consult - C/w home med: Protonix 40mg  daily - Trend CBC - Transfuse if <7  Persistent Atrial Fibrillation Shown on EKG. Rate controlled with metoprolol tartrate. Not on anticoagulation due to GI bleed. - Telemetry - C/w metoprolol tartrate 12.5mg  BID  Hx of Anxiety - C/w home med: fluoxetine 10mg  daily, clonazepam 0.5mg  daily PRN  DVT prophx: SCDs Diet: NPO for now Code: Full  Dispo: Admit patient to Inpatient with expected length of stay greater than 2 midnights.  Signed: Mosetta Anis, MD 09/23/2018, 2:05 PM  Pager: [phone]

## 2018-09-23 NOTE — Consult Note (Signed)
[facility] Consult: 2:42 PM 09/23/2018  LOS: 0 days     Referring Provider: Gilles Chiquito MD  Primary Care Physician:  Patient, No Pcp Per Primary Gastroenterologist:       Reason for Consultation: Painless hematochezia   HPI: Hannah Collier is a 73 y.o. female.  Hx Roux-en-Y gastric bypass 2005.  Alcohol abuse.  Arthritis, NSAID use.  Psoriatic arthritis.  Atrial fibrillation not on anticoagulation.  Admitted 2/16 -09/13/18 with GI bleed, likely diverticular bleed. 09/11/18 abdominal ultrasound:   Gallstones, no cholecystitis.  CBD 8 mm.  Fatty liver. 09/12/2018 EGD.  Benign, mild, nonobstructing stenosis at EG J.  Gastric bypass with normal-sized pouch and intact staple line.  Anastomosis healthy with visible sutures.  Small hiatal hernia.  Normal examined jejunum. 09/13/2018 colonoscopy.   3, 7 to 14 mm, polyps at the ascending colon and cecum were resected.  Left colon diverticulosis.  Diverticular associated narrowing with diverticular spasm.  Patient presumed to have had diverticular bleed, though no active bleeding or presence of blood on the colonoscopy.   Pathology showed tubular adenomas and sessile serrated polyp indeterminate for dysplasia Received 2 U PRBCs and parenteral iron for iron deficiency during admission. Discharged on Protonix 40 mg/day.  She has been compliant with this and has not been using any NSAIDs.  Patient is back in the ED with hematochezia.  At 1 AM today she had a small amount of blood per rectum.  Within a few hours and since then she has had a total of 4 episodes of larger volume hematochezia.  This last BM which is sitting in the bedside commode shows current jelly, clotted appearance to the bloody stool.  There is no abdominal pain, nausea, weakness, dizziness. Hgb nadir 6.7  2/16, 8.8 at discharge 2/18.   9.5 on 2/26.  8.1 today.   Elevated bilirubin 3.2 during admission, and 0.8 today.  Transaminases and alk phos normal.  Patient lives at home with her husband who has some disability due to post polio syndrome.  He is able to ambulate with a walker but she basically waits on him and cooks cleans and is his primary caregiver.  She has been married to her husband for 7 years, remarriage for both of them.  They moved here from [place] 4 years ago to be closer to his oldest son.  However they have become estranged from the son.  They would move back to [facility] but housing prices there have increased so much that they are priced out of the market    Past Medical History:  Diagnosis Date  . Gastric bypass status for obesity   . Metabolic syndrome   . PCOS (polycystic ovarian syndrome)     Past Surgical History:  Procedure Laterality Date  . COLONOSCOPY WITH PROPOFOL N/A 09/13/2018   Procedure: COLONOSCOPY WITH PROPOFOL;  Surgeon: Ladene Artist, MD;  Location: [facility] ENDOSCOPY;  Service: Endoscopy;  Laterality: N/A;  . ESOPHAGOGASTRODUODENOSCOPY (EGD) WITH PROPOFOL N/A 09/12/2018   Procedure: ESOPHAGOGASTRODUODENOSCOPY (EGD) WITH PROPOFOL;  Surgeon: Ladene Artist, MD;  Location: [facility]  ENDOSCOPY;  Service: Endoscopy;  Laterality: N/A;  . POLYPECTOMY  09/13/2018   Procedure: POLYPECTOMY;  Surgeon: Ladene Artist, MD;  Location: [facility] ENDOSCOPY;  Service: Endoscopy;;  . REPLACEMENT TOTAL KNEE    . TONSILLECTOMY      Prior to Admission medications   Medication Sig Start Date End Date Taking? Authorizing Provider  cetirizine (KLS ALLER-TEC) 10 MG tablet Take 10 mg by mouth daily.   Yes [provider]  clonazePAM (KLONOPIN) 0.5 MG tablet Take 1 tablet (0.5 mg total) by mouth daily as needed for anxiety. 09/21/18 09/21/19 Yes Velna Ochs, MD  diclofenac sodium (VOLTAREN) 1 % GEL Apply 2 g topically 4 (four) times daily. 09/13/18  Yes [name], MD    FLUoxetine (PROZAC) 10 MG capsule Take 1 capsule (10 mg total) by mouth daily. 09/21/18 09/21/19 Yes Velna Ochs, MD  lidocaine (LIDODERM) 5 % Place 1 patch onto the skin as needed. Remove & Discard patch within 12 hours or as directed by MD 09/13/18  Yes [name], MD  metoprolol tartrate (LOPRESSOR) 25 MG tablet Take 0.5 tablets (12.5 mg total) by mouth 2 (two) times daily. 09/21/18 09/21/19 Yes Velna Ochs, MD  pantoprazole (PROTONIX) 40 MG tablet Take 1 tablet (40 mg total) by mouth daily at 6 (six) AM. 09/14/18  Yes [name], MD  triamcinolone cream (KENALOG) 0.1 % Apply 1 application topically 2 (two) times daily. 06/19/17  Yes Wynona Luna, MD    Scheduled Meds: . diclofenac sodium  2 g Topical QID  . [START ON 09/24/2018] FLUoxetine  10 mg Oral Daily  . loratadine  10 mg Oral Daily  . sodium chloride flush  3 mL Intravenous Q12H  . triamcinolone cream  1 application Topical BID   Infusions:  PRN Meds: acetaminophen **OR** acetaminophen, clonazePAM, lidocaine   Allergies as of 09/23/2018 - Review Complete 09/23/2018  Allergen Reaction Noted  . Sulfa antibiotics Rash 06/19/2017    No family history on file.  Social History   Socioeconomic History  . Marital status: Married    Spouse name: Not on file  . Number of children: Not on file  . Years of education: Not on file  . Highest education level: Not on file  Occupational History  . Not on file  Social Needs  . Financial resource strain: Not on file  . Food insecurity:    Worry: Not on file    Inability: Not on file  . Transportation needs:    Medical: Not on file    Non-medical: Not on file  Tobacco Use  . Smoking status: Never Smoker  . Smokeless tobacco: Never Used  Substance and Sexual Activity  . Alcohol use: Yes  . Drug use: Not on file  . Sexual activity: Not on file  Lifestyle  . Physical activity:    Days per week: Not on file    Minutes per session: Not on file  .  Stress: Not on file  Relationships  . Social connections:    Talks on phone: Not on file    Gets together: Not on file    Attends religious service: Not on file    Active member of club or organization: Not on file    Attends meetings of clubs or organizations: Not on file    Relationship status: Not on file  . Intimate partner violence:    Fear of current or ex partner: Not on file    Emotionally abused: Not on file  Physically abused: Not on file    Forced sexual activity: Not on file  Other Topics Concern  . Not on file  Social History Narrative  . Not on file    REVIEW OF SYSTEMS: Constitutional: No weakness, no dizziness. ENT:  No nose bleeds Pulm: No shortness of breath.  No cough. CV:  No sense of palpitations tachycardia.  No chest pain, no LE edema.  GU:  No hematuria, no frequency GI: Per HPI. Heme: Other than the GI bleeding, has not had any unusual bleeding or bruising. Transfusions: Per HPI. Neuro:  No headaches, no peripheral tingling or numbness.   No syncope, no seizures. Derm:  No itching, no rash or sores.  Endocrine:  No sweats or chills.  No polyuria or dysuria Immunization: Flu shot in 05/2018 Travel:  None beyond local counties in last few months.    PHYSICAL EXAM: Vital signs in last 24 hours: Vitals:   09/23/18 1300 09/23/18 1330  BP: (!) 145/81 136/78  Pulse:  74  Resp: (!) 22 17  Temp:    SpO2: 98% 99%   Wt Readings from Last 3 Encounters:  09/21/18 94.7 kg  09/13/18 93 kg    General: Pale, overweight, not acutely ill looking WF. Head: No facial asymmetry or swelling Eyes: No scleral icterus.  No conjunctival pallor.  EOMI. Ears: Not HOH Nose: No congestion or discharge Mouth: Oropharynx moist, pink, clear.  Tongue midline. Neck: No JVD, no masses, no thyromegaly. Lungs: Clear bilaterally.  No labored breathing or cough. Heart: Irregularly irregular.  Rate controlled.  No MRG.  S1, S2 present Abdomen: Soft.  Not tender or  distended.  Active bowel sounds.  No HSM, masses, bruits, hernias..   Rectal: No DRE performed.  I did visualize the stool in the bedside commode which she just evacuated, it has a current jelly, dark purple appearance with obvious blood. Musc/Skeltl: No joint redness, swelling or significant deformity. Extremities: No CCE.  Feet are warm. Neurologic: Alert.  Oriented x3.  Moves all 4 limbs, no weakness, no tremor.  No gross deficits. Skin: No rash, no sores, no telangiectasia. Tattoos: None Nodes: No cervical adenopathy. Psych: Cooperative, pleasant, speech fluid.  Intake/Output from previous day: No intake/output data recorded. Intake/Output this shift: Total I/O In: -  Out: 200 [Urine:200]  LAB RESULTS: Recent Labs    09/21/18 1637 09/23/18 1134  WBC 4.3 5.1  HGB 9.5* 8.1*  HCT 31.0* 27.0*  PLT 228 242   BMET Lab Results  Component Value Date   NA 135 09/23/2018   NA 139 09/21/2018   NA 133 (L) 09/13/2018   K 3.9 09/23/2018   K 4.4 09/21/2018   K 3.6 09/13/2018   CL 102 09/23/2018   CL 101 09/21/2018   CL 99 09/13/2018   CO2 23 09/23/2018   CO2 21 09/21/2018   CO2 23 09/13/2018   GLUCOSE 95 09/23/2018   GLUCOSE 91 09/21/2018   GLUCOSE 121 (H) 09/13/2018   BUN 9 09/23/2018   BUN 10 09/21/2018   BUN 7 (L) 09/13/2018   CREATININE 0.98 09/23/2018   CREATININE 0.79 09/21/2018   CREATININE 1.01 (H) 09/13/2018   CALCIUM 8.9 09/23/2018   CALCIUM 9.6 09/21/2018   CALCIUM 9.3 09/13/2018   LFT Recent Labs    09/21/18 1637 09/23/18 1134  PROT 7.0 6.4*  ALBUMIN 3.9 3.3*  AST 36 41  ALT 20 21  ALKPHOS 99 67  BILITOT 1.7* 1.8*   PT/INR Lab Results  Component Value  Date   INR 1.05 09/11/2018    IMPRESSION:   *    LGIB.   ? Diverticular (recurrent) versus post polypectomy bleed?  *    Normocytic anemia.  ABL anemia.  Received 2 units PRBCs and parenteral iron during admission 10 days ago. Although current Hgb is better than it was at admission 10 days  ago and the microcytosis has resolved, her Hgb has gone from 9.5 >> 8.1 in the last 48 hours.  *    Atrial fibrillation.  Diagnosed during recent admission.  Not started on anticoagulation.  Has initial cardiology visit set up for 4/3 with Dr. Percival Spanish.  *   Mild, 8 mm, not obstructed, CBD dilation, fatty liver on ultrasound 12 days ago.  Heavy, neck drinker of 2-4 alcoholic beverages/night.  LFT pattern from previous admission looks consistent with alcoholic hepatitis.   LFTs today much improved.   PLAN:     *    No need for IV Protonix.  Can continue oral Protonix.  Serial CBC. ?  CT angiogram with embolization if active bleeding found.? ?  Colonoscopy prep with colonoscopy tomorrow and assessment and treatment for possible post polypectomy bleed?    ? Tagged RBC bleeding scan? All of these options were discussed with the patient.  Specifically potential for repeat colonoscopy.  She is okay with scope if that is the physician's decision.      Azucena Freed  09/23/2018, 2:42 PM Phone [phone]

## 2018-09-23 NOTE — ED Notes (Signed)
Attempted report x 1, on hold for >5 minutes.

## 2018-09-23 NOTE — Telephone Encounter (Signed)
Discussed with Glenda and Dr. Philipp Ovens, advised ED or UC (preferably ED) as we have limited hours today.

## 2018-09-23 NOTE — Progress Notes (Signed)
Results for Hannah Collier, Hannah Collier (MRN [id number]) as of 09/23/2018 22:06  Ref. Range 09/23/2018 21:32  Hemoglobin Latest Ref Range: 12.0 - 15.0 g/dL 6.2 (LL)  MD on call  Paged.

## 2018-09-23 NOTE — H&P (View-Only) (Signed)
[facility] Consult: 2:42 PM 09/23/2018  LOS: 0 days     Referring Provider: Gilles Chiquito MD  Primary Care Physician:  Patient, No Pcp Per Primary Gastroenterologist:       Reason for Consultation: Painless hematochezia   HPI: Rateel Beldin is a 73 y.o. female.  Hx Roux-en-Y gastric bypass 2005.  Alcohol abuse.  Arthritis, NSAID use.  Psoriatic arthritis.  Atrial fibrillation not on anticoagulation.  Admitted 2/16 -09/13/18 with GI bleed, likely diverticular bleed. 09/11/18 abdominal ultrasound:   Gallstones, no cholecystitis.  CBD 8 mm.  Fatty liver. 09/12/2018 EGD.  Benign, mild, nonobstructing stenosis at EG J.  Gastric bypass with normal-sized pouch and intact staple line.  Anastomosis healthy with visible sutures.  Small hiatal hernia.  Normal examined jejunum. 09/13/2018 colonoscopy.   3, 7 to 14 mm, polyps at the ascending colon and cecum were resected.  Left colon diverticulosis.  Diverticular associated narrowing with diverticular spasm.  Patient presumed to have had diverticular bleed, though no active bleeding or presence of blood on the colonoscopy.   Pathology showed tubular adenomas and sessile serrated polyp indeterminate for dysplasia Received 2 U PRBCs and parenteral iron for iron deficiency during admission. Discharged on Protonix 40 mg/day.  She has been compliant with this and has not been using any NSAIDs.  Patient is back in the ED with hematochezia.  At 1 AM today she had a small amount of blood per rectum.  Within a few hours and since then she has had a total of 4 episodes of larger volume hematochezia.  This last BM which is sitting in the bedside commode shows current jelly, clotted appearance to the bloody stool.  There is no abdominal pain, nausea, weakness, dizziness. Hgb nadir 6.7  2/16, 8.8 at discharge 2/18.   9.5 on 2/26.  8.1 today.   Elevated bilirubin 3.2 during admission, and 0.8 today.  Transaminases and alk phos normal.  Patient lives at home with her husband who has some disability due to post polio syndrome.  He is able to ambulate with a walker but she basically waits on him and cooks cleans and is his primary caregiver.  She has been married to her husband for 7 years, remarriage for both of them.  They moved here from [place] 4 years ago to be closer to his oldest son.  However they have become estranged from the son.  They would move back to [facility] but housing prices there have increased so much that they are priced out of the market    Past Medical History:  Diagnosis Date  . Gastric bypass status for obesity   . Metabolic syndrome   . PCOS (polycystic ovarian syndrome)     Past Surgical History:  Procedure Laterality Date  . COLONOSCOPY WITH PROPOFOL N/A 09/13/2018   Procedure: COLONOSCOPY WITH PROPOFOL;  Surgeon: Ladene Artist, MD;  Location: [facility] ENDOSCOPY;  Service: Endoscopy;  Laterality: N/A;  . ESOPHAGOGASTRODUODENOSCOPY (EGD) WITH PROPOFOL N/A 09/12/2018   Procedure: ESOPHAGOGASTRODUODENOSCOPY (EGD) WITH PROPOFOL;  Surgeon: Ladene Artist, MD;  Location: [facility]  ENDOSCOPY;  Service: Endoscopy;  Laterality: N/A;  . POLYPECTOMY  09/13/2018   Procedure: POLYPECTOMY;  Surgeon: Ladene Artist, MD;  Location: [facility] ENDOSCOPY;  Service: Endoscopy;;  . REPLACEMENT TOTAL KNEE    . TONSILLECTOMY      Prior to Admission medications   Medication Sig Start Date End Date Taking? Authorizing Provider  cetirizine (KLS ALLER-TEC) 10 MG tablet Take 10 mg by mouth daily.   Yes [provider]  clonazePAM (KLONOPIN) 0.5 MG tablet Take 1 tablet (0.5 mg total) by mouth daily as needed for anxiety. 09/21/18 09/21/19 Yes Velna Ochs, MD  diclofenac sodium (VOLTAREN) 1 % GEL Apply 2 g topically 4 (four) times daily. 09/13/18  Yes [name], MD    FLUoxetine (PROZAC) 10 MG capsule Take 1 capsule (10 mg total) by mouth daily. 09/21/18 09/21/19 Yes Velna Ochs, MD  lidocaine (LIDODERM) 5 % Place 1 patch onto the skin as needed. Remove & Discard patch within 12 hours or as directed by MD 09/13/18  Yes [name], MD  metoprolol tartrate (LOPRESSOR) 25 MG tablet Take 0.5 tablets (12.5 mg total) by mouth 2 (two) times daily. 09/21/18 09/21/19 Yes Velna Ochs, MD  pantoprazole (PROTONIX) 40 MG tablet Take 1 tablet (40 mg total) by mouth daily at 6 (six) AM. 09/14/18  Yes [name], MD  triamcinolone cream (KENALOG) 0.1 % Apply 1 application topically 2 (two) times daily. 06/19/17  Yes Wynona Luna, MD    Scheduled Meds: . diclofenac sodium  2 g Topical QID  . [START ON 09/24/2018] FLUoxetine  10 mg Oral Daily  . loratadine  10 mg Oral Daily  . sodium chloride flush  3 mL Intravenous Q12H  . triamcinolone cream  1 application Topical BID   Infusions:  PRN Meds: acetaminophen **OR** acetaminophen, clonazePAM, lidocaine   Allergies as of 09/23/2018 - Review Complete 09/23/2018  Allergen Reaction Noted  . Sulfa antibiotics Rash 06/19/2017    No family history on file.  Social History   Socioeconomic History  . Marital status: Married    Spouse name: Not on file  . Number of children: Not on file  . Years of education: Not on file  . Highest education level: Not on file  Occupational History  . Not on file  Social Needs  . Financial resource strain: Not on file  . Food insecurity:    Worry: Not on file    Inability: Not on file  . Transportation needs:    Medical: Not on file    Non-medical: Not on file  Tobacco Use  . Smoking status: Never Smoker  . Smokeless tobacco: Never Used  Substance and Sexual Activity  . Alcohol use: Yes  . Drug use: Not on file  . Sexual activity: Not on file  Lifestyle  . Physical activity:    Days per week: Not on file    Minutes per session: Not on file  .  Stress: Not on file  Relationships  . Social connections:    Talks on phone: Not on file    Gets together: Not on file    Attends religious service: Not on file    Active member of club or organization: Not on file    Attends meetings of clubs or organizations: Not on file    Relationship status: Not on file  . Intimate partner violence:    Fear of current or ex partner: Not on file    Emotionally abused: Not on file  Physically abused: Not on file    Forced sexual activity: Not on file  Other Topics Concern  . Not on file  Social History Narrative  . Not on file    REVIEW OF SYSTEMS: Constitutional: No weakness, no dizziness. ENT:  No nose bleeds Pulm: No shortness of breath.  No cough. CV:  No sense of palpitations tachycardia.  No chest pain, no LE edema.  GU:  No hematuria, no frequency GI: Per HPI. Heme: Other than the GI bleeding, has not had any unusual bleeding or bruising. Transfusions: Per HPI. Neuro:  No headaches, no peripheral tingling or numbness.   No syncope, no seizures. Derm:  No itching, no rash or sores.  Endocrine:  No sweats or chills.  No polyuria or dysuria Immunization: Flu shot in 05/2018 Travel:  None beyond local counties in last few months.    PHYSICAL EXAM: Vital signs in last 24 hours: Vitals:   09/23/18 1300 09/23/18 1330  BP: (!) 145/81 136/78  Pulse:  74  Resp: (!) 22 17  Temp:    SpO2: 98% 99%   Wt Readings from Last 3 Encounters:  09/21/18 94.7 kg  09/13/18 93 kg    General: Pale, overweight, not acutely ill looking WF. Head: No facial asymmetry or swelling Eyes: No scleral icterus.  No conjunctival pallor.  EOMI. Ears: Not HOH Nose: No congestion or discharge Mouth: Oropharynx moist, pink, clear.  Tongue midline. Neck: No JVD, no masses, no thyromegaly. Lungs: Clear bilaterally.  No labored breathing or cough. Heart: Irregularly irregular.  Rate controlled.  No MRG.  S1, S2 present Abdomen: Soft.  Not tender or  distended.  Active bowel sounds.  No HSM, masses, bruits, hernias..   Rectal: No DRE performed.  I did visualize the stool in the bedside commode which she just evacuated, it has a current jelly, dark purple appearance with obvious blood. Musc/Skeltl: No joint redness, swelling or significant deformity. Extremities: No CCE.  Feet are warm. Neurologic: Alert.  Oriented x3.  Moves all 4 limbs, no weakness, no tremor.  No gross deficits. Skin: No rash, no sores, no telangiectasia. Tattoos: None Nodes: No cervical adenopathy. Psych: Cooperative, pleasant, speech fluid.  Intake/Output from previous day: No intake/output data recorded. Intake/Output this shift: Total I/O In: -  Out: 200 [Urine:200]  LAB RESULTS: Recent Labs    09/21/18 1637 09/23/18 1134  WBC 4.3 5.1  HGB 9.5* 8.1*  HCT 31.0* 27.0*  PLT 228 242   BMET Lab Results  Component Value Date   NA 135 09/23/2018   NA 139 09/21/2018   NA 133 (L) 09/13/2018   K 3.9 09/23/2018   K 4.4 09/21/2018   K 3.6 09/13/2018   CL 102 09/23/2018   CL 101 09/21/2018   CL 99 09/13/2018   CO2 23 09/23/2018   CO2 21 09/21/2018   CO2 23 09/13/2018   GLUCOSE 95 09/23/2018   GLUCOSE 91 09/21/2018   GLUCOSE 121 (H) 09/13/2018   BUN 9 09/23/2018   BUN 10 09/21/2018   BUN 7 (L) 09/13/2018   CREATININE 0.98 09/23/2018   CREATININE 0.79 09/21/2018   CREATININE 1.01 (H) 09/13/2018   CALCIUM 8.9 09/23/2018   CALCIUM 9.6 09/21/2018   CALCIUM 9.3 09/13/2018   LFT Recent Labs    09/21/18 1637 09/23/18 1134  PROT 7.0 6.4*  ALBUMIN 3.9 3.3*  AST 36 41  ALT 20 21  ALKPHOS 99 67  BILITOT 1.7* 1.8*   PT/INR Lab Results  Component Value  Date   INR 1.05 09/11/2018    IMPRESSION:   *    LGIB.   ? Diverticular (recurrent) versus post polypectomy bleed?  *    Normocytic anemia.  ABL anemia.  Received 2 units PRBCs and parenteral iron during admission 10 days ago. Although current Hgb is better than it was at admission 10 days  ago and the microcytosis has resolved, her Hgb has gone from 9.5 >> 8.1 in the last 48 hours.  *    Atrial fibrillation.  Diagnosed during recent admission.  Not started on anticoagulation.  Has initial cardiology visit set up for 4/3 with Dr. Percival Spanish.  *   Mild, 8 mm, not obstructed, CBD dilation, fatty liver on ultrasound 12 days ago.  Heavy, neck drinker of 2-4 alcoholic beverages/night.  LFT pattern from previous admission looks consistent with alcoholic hepatitis.   LFTs today much improved.   PLAN:     *    No need for IV Protonix.  Can continue oral Protonix.  Serial CBC. ?  CT angiogram with embolization if active bleeding found.? ?  Colonoscopy prep with colonoscopy tomorrow and assessment and treatment for possible post polypectomy bleed?    ? Tagged RBC bleeding scan? All of these options were discussed with the patient.  Specifically potential for repeat colonoscopy.  She is okay with scope if that is the physician's decision.      Azucena Freed  09/23/2018, 2:42 PM Phone [phone]

## 2018-09-23 NOTE — ED Triage Notes (Signed)
Pt to ER for evaluation of GI bleeding, two episodes reported this AM, bright red, was seen here and admitted for GI bleed last week requiring two units transfusion for Hgb <7. Pt is in NAD. VSS. A/o x4. No abd pain reported. No shortness of breath, dizziness, or weakness.

## 2018-09-23 NOTE — Progress Notes (Signed)
RN paged that patient having large amounts of active bleeding from rectum associated with dizziness and nausea. She was seen at the bedside and seen comfortably laying in bed, hemodynamically stable and in no distress. She states she has been actively bleeding since she was admitted and it worsened around 4 pm. She states at first the consistency was clot like; however, after the bowel prep she noticed it became more liquid in consistency. She states she felt very dizzy as she got up to go to the bathroom. Her most recent Hgb dropped from 8.1 to 6.2 and two units of PRBC were ordered. She was given a bowel prep for her colonoscopy around 9 pm.   Vitals:   09/23/18 2239 09/23/18 2313  BP: 131/76 116/60  Pulse: 94 98  Resp: 20 20  Temp: (!) 97.5 F (36.4 C) 97.8 F (36.6 C)  SpO2: 94% 96%   Physical exam:  Gen: no acute distress, comfortably lying in bed Abdomen: soft, non-distended, no tenderness, bowel sounds present Rectal: no active bleeding appreciated on exam, however, blood noted in commode   Assessment/Plan: Patient found to have worsening rectal bleeding most likely in the setting of bowel prep. She states she has been actively bleeding since she presented to the ED; however, now the consistency is more liquid in nature. Her Hgb dropped from 8.1 to 6.2 and 2 units of PRBC were ordered. She is hemodynamically stable. Will continue to monitor CBC and vitals closely.  - 2 units PRBC - Follow up post transfusion h&h - Consider calling GI if she becomes unstable or worsens

## 2018-09-23 NOTE — Telephone Encounter (Signed)
Call from pt - stated she's having a GI bleed. Stated she had a reg BM around 1 AM then 20 mins later had another one which was mixed w/dark and bright blood,she stated tarry w./clots. voiced no other symptoms "I feel fine". She was seen on Wed in [facility]  Talked to Dr Philipp Ovens, who saw pt on Wed. After talking to [facility], Dr Daryll Drown; decided needs to go to [facility] or ER to be evaluated.  Informed pt to go to UC or ER - stated she will.

## 2018-09-23 NOTE — ED Provider Notes (Addendum)
[facility] EMERGENCY DEPARTMENT Provider Note   CSN: [id number] Arrival date & time: 09/23/18  1121    History   Chief Complaint Chief Complaint  Patient presents with  . GI Bleeding    HPI Hannah Collier is a 73 y.o. female.     HPI Patient presents to the emergency department with GI and rectal bleeding.  The patient states that she was recently admitted for rectal bleeding and they found that she had 2 areas of diverticulitis that had bleeding associated with it.  Patient states that last night she noticed she got up to go to the bathroom and around 1 AM and had a bowel movement that had blood in it.  The patient states she had another bowel movement that had more blood.  She had a third that also had a large amount of blood that was bright and dark.  The patient denies chest pain, shortness of breath, headache,blurred vision, neck pain, fever, cough, weakness, numbness, dizziness, anorexia, edema, abdominal pain, nausea, vomiting, diarrhea, rash, back pain, dysuria, hematemesis, , near syncope, or syncope. Past Medical History:  Diagnosis Date  . Gastric bypass status for obesity   . Metabolic syndrome   . PCOS (polycystic ovarian syndrome)     Patient Active Problem List   Diagnosis Date Noted  . History of gastric bypass 09/21/2018  . Generalized anxiety disorder 09/21/2018  . Transaminitis 09/21/2018  . Benign neoplasm of ascending colon   . Benign neoplasm of cecum   . Iron deficiency anemia   . Symptomatic anemia   . GI bleed 09/11/2018  . New onset a-fib ([place]) 09/11/2018  . Psoriatic arthritis ([place]) 09/11/2018  . Acute blood loss anemia 09/11/2018    Past Surgical History:  Procedure Laterality Date  . COLONOSCOPY WITH PROPOFOL N/A 09/13/2018   Procedure: COLONOSCOPY WITH PROPOFOL;  Surgeon: Ladene Artist, MD;  Location: [facility] ENDOSCOPY;  Service: Endoscopy;  Laterality: N/A;  . ESOPHAGOGASTRODUODENOSCOPY (EGD) WITH PROPOFOL N/A 09/12/2018     Procedure: ESOPHAGOGASTRODUODENOSCOPY (EGD) WITH PROPOFOL;  Surgeon: Ladene Artist, MD;  Location: [facility] ENDOSCOPY;  Service: Endoscopy;  Laterality: N/A;  . POLYPECTOMY  09/13/2018   Procedure: POLYPECTOMY;  Surgeon: Ladene Artist, MD;  Location: [facility] ENDOSCOPY;  Service: Endoscopy;;  . REPLACEMENT TOTAL KNEE    . TONSILLECTOMY       OB History   No obstetric history on file.      Home Medications    Prior to Admission medications   Medication Sig Start Date End Date Taking? Authorizing Provider  cetirizine (KLS ALLER-TEC) 10 MG tablet Take 10 mg by mouth daily.    [provider]  clonazePAM (KLONOPIN) 0.5 MG tablet Take 1 tablet (0.5 mg total) by mouth daily as needed for anxiety. 09/21/18 09/21/19  Velna Ochs, MD  diclofenac sodium (VOLTAREN) 1 % GEL Apply 2 g topically 4 (four) times daily. 09/13/18   [name], MD  FLUoxetine (PROZAC) 10 MG capsule Take 1 capsule (10 mg total) by mouth daily. 09/21/18 09/21/19  Velna Ochs, MD  lidocaine (LIDODERM) 5 % Place 1 patch onto the skin as needed. Remove & Discard patch within 12 hours or as directed by MD 09/13/18   [name], MD  metoprolol tartrate (LOPRESSOR) 25 MG tablet Take 0.5 tablets (12.5 mg total) by mouth 2 (two) times daily. 09/21/18 09/21/19  Velna Ochs, MD  pantoprazole (PROTONIX) 40 MG tablet Take 1 tablet (40 mg total) by mouth daily at 6 (six) AM. 09/14/18  [name], MD  traZODone (DESYREL) 50 MG tablet Take 0.5 tablets (25 mg total) by mouth at bedtime. Patient not taking: Reported on 09/11/2018 06/19/17   Wynona Luna, MD  triamcinolone cream (KENALOG) 0.1 % Apply 1 application topically 2 (two) times daily. Patient not taking: Reported on 09/11/2018 06/19/17   Wynona Luna, MD    Family History No family history on file.  Social History Social History   Tobacco Use  . Smoking status: Never Smoker  . Smokeless tobacco: Never Used  Substance Use  Topics  . Alcohol use: Yes  . Drug use: Not on file     Allergies   Sulfa antibiotics   Review of Systems Review of Systems All other systems negative except as documented in the HPI. All pertinent positives and negatives as reviewed in the HPI.  Physical Exam Updated Vital Signs BP (!) 143/100 (BP Location: Right Arm)   Pulse 82   Temp 97.7 F (36.5 C) (Oral)   Resp 18   SpO2 100%   Physical Exam Vitals signs and nursing note reviewed.  Constitutional:      General: She is not in acute distress.    Appearance: She is well-developed.  HENT:     Head: Normocephalic and atraumatic.  Eyes:     Pupils: Pupils are equal, round, and reactive to light.  Neck:     Musculoskeletal: Normal range of motion and neck supple.  Cardiovascular:     Rate and Rhythm: Normal rate and regular rhythm.     Heart sounds: Normal heart sounds. No murmur. No friction rub. No gallop.   Pulmonary:     Effort: Pulmonary effort is normal. No respiratory distress.     Breath sounds: Normal breath sounds. No wheezing.  Abdominal:     General: Bowel sounds are normal. There is no distension.     Palpations: Abdomen is soft.     Tenderness: There is no abdominal tenderness. There is no guarding.  Genitourinary:    Rectum: Guaiac result positive.     Comments: Patient had darker thicker bloody material along with blood that was later but not bright red. Skin:    General: Skin is warm and dry.     Capillary Refill: Capillary refill takes less than 2 seconds.     Findings: No erythema or rash.  Neurological:     Mental Status: She is alert and oriented to person, place, and time.     Motor: No abnormal muscle tone.     Coordination: Coordination normal.  Psychiatric:        Behavior: Behavior normal.      ED Treatments / Results  Labs (all labs ordered are listed, but only abnormal results are displayed) Labs Reviewed  COMPREHENSIVE METABOLIC PANEL - Abnormal; Notable for the following  components:      Result Value   Total Protein 6.4 (*)    Albumin 3.3 (*)    Total Bilirubin 1.8 (*)    GFR calc non Af Amer 58 (*)    All other components within normal limits  CBC WITH DIFFERENTIAL/PLATELET - Abnormal; Notable for the following components:   RBC 3.26 (*)    Hemoglobin 8.1 (*)    HCT 27.0 (*)    MCH 24.8 (*)    RDW 29.4 (*)    All other components within normal limits  POC OCCULT BLOOD, ED - Abnormal; Notable for the following components:   Fecal Occult Bld POSITIVE (*)  All other components within normal limits  TYPE AND SCREEN    EKG EKG Interpretation  Date/Time:  Friday September 23 2018 12:36:00 EST Ventricular Rate:  71 PR Interval:    QRS Duration: 99 QT Interval:  452 QTC Calculation: 492 R Axis:   76 Text Interpretation:  Atrial fibrillation Anteroseptal infarct, age indeterminate Baseline wander in lead(s) V4 Since last tracing rate slower Confirmed by Isla Pence [id number]) on 09/23/2018 12:42:59 PM   Radiology No results found.  Procedures Procedures (including critical care time)  Medications Ordered in ED Medications  pantoprazole (PROTONIX) 80 mg in sodium chloride 0.9 % 100 mL IVPB (has no administration in time range)     Initial Impression / Assessment and Plan / ED Course  I have reviewed the triage vital signs and the nursing notes.  Pertinent labs & imaging results that were available during my care of the patient were reviewed by me and considered in my medical decision making (see chart for details).       Speak with her doctors for admission.  She will also need to see GI as well we did start Protonix.  Patient has been otherwise stable here in the emergency department.  Globin has dropped again since she was discharged in the hospital.  Final Clinical Impressions(s) / ED Diagnoses   Final diagnoses:  None    ED Discharge Orders    None        Dalia Heading, PA-C 09/23/18 Tyronza Julie,  MD 09/23/18 1350

## 2018-09-24 ENCOUNTER — Encounter (HOSPITAL_COMMUNITY)
Admission: EM | Disposition: A | Discharge status: Home / Self Care | Payer: Self-pay | Source: Home / Self Care | Attending: Internal Medicine

## 2018-09-24 ENCOUNTER — Encounter (HOSPITAL_COMMUNITY): Payer: Self-pay | Admitting: Certified Registered"

## 2018-09-24 ENCOUNTER — Inpatient Hospital Stay (HOSPITAL_COMMUNITY): Payer: Medicare Other | Admitting: Certified Registered"

## 2018-09-24 DIAGNOSIS — L4050 Arthropathic psoriasis, unspecified: Secondary | ICD-10-CM

## 2018-09-24 DIAGNOSIS — Z9884 Bariatric surgery status: Secondary | ICD-10-CM

## 2018-09-24 DIAGNOSIS — K633 Ulcer of intestine: Secondary | ICD-10-CM

## 2018-09-24 DIAGNOSIS — Z8719 Personal history of other diseases of the digestive system: Secondary | ICD-10-CM

## 2018-09-24 DIAGNOSIS — K921 Melena: Secondary | ICD-10-CM

## 2018-09-24 DIAGNOSIS — I4819 Other persistent atrial fibrillation: Secondary | ICD-10-CM

## 2018-09-24 DIAGNOSIS — K922 Gastrointestinal hemorrhage, unspecified: Secondary | ICD-10-CM

## 2018-09-24 DIAGNOSIS — K5730 Diverticulosis of large intestine without perforation or abscess without bleeding: Secondary | ICD-10-CM

## 2018-09-24 DIAGNOSIS — Z9889 Other specified postprocedural states: Secondary | ICD-10-CM

## 2018-09-24 DIAGNOSIS — E282 Polycystic ovarian syndrome: Secondary | ICD-10-CM

## 2018-09-24 HISTORY — PX: COLONOSCOPY WITH PROPOFOL: SHX5780

## 2018-09-24 LAB — CBC
HCT: 16.5 % — ABNORMAL LOW (ref 36.0–46.0)
HCT: 25.6 % — ABNORMAL LOW (ref 36.0–46.0)
HEMOGLOBIN: 8.4 g/dL — AB (ref 12.0–15.0)
Hemoglobin: 5.1 g/dL — CL (ref 12.0–15.0)
MCH: 25.8 pg — ABNORMAL LOW (ref 26.0–34.0)
MCH: 26.8 pg (ref 26.0–34.0)
MCHC: 30.9 g/dL (ref 30.0–36.0)
MCHC: 32.8 g/dL (ref 30.0–36.0)
MCV: 83.3 fL (ref 80.0–100.0)
MCV: 81.8 fL (ref 80.0–100.0)
Platelets: 125 10*3/uL — ABNORMAL LOW (ref 150–400)
Platelets: 138 10*3/uL — ABNORMAL LOW (ref 150–400)
RBC: 1.98 MIL/uL — AB (ref 3.87–5.11)
RBC: 3.13 MIL/uL — ABNORMAL LOW (ref 3.87–5.11)
RDW: 24.1 % — ABNORMAL HIGH (ref 11.5–15.5)
RDW: 20.8 % — ABNORMAL HIGH (ref 11.5–15.5)
WBC: 4.3 10*3/uL (ref 4.0–10.5)
WBC: 6.2 10*3/uL (ref 4.0–10.5)
nRBC: 0 % (ref 0.0–0.2)
nRBC: 0 % (ref 0.0–0.2)

## 2018-09-24 LAB — BASIC METABOLIC PANEL
ANION GAP: 7 (ref 5–15)
BUN: 6 mg/dL — ABNORMAL LOW (ref 8–23)
CO2: 23 mmol/L (ref 22–32)
Calcium: 8.3 mg/dL — ABNORMAL LOW (ref 8.9–10.3)
Chloride: 105 mmol/L (ref 98–111)
Creatinine, Ser: 0.93 mg/dL (ref 0.44–1.00)
GFR calc non Af Amer: >60 mL/min (ref >60)
Glucose, Bld: 100 mg/dL — ABNORMAL HIGH (ref 70–99)
POTASSIUM: 3.6 mmol/L (ref 3.5–5.1)
Sodium: 135 mmol/L (ref 135–145)

## 2018-09-24 LAB — POCT I-STAT 4, (NA,K, GLUC, HGB,HCT)
Glucose, Bld: 95 mg/dL (ref 70–99)
HCT: 18 % — ABNORMAL LOW (ref 36.0–46.0)
Hemoglobin: 6.1 g/dL — CL (ref 12.0–15.0)
POTASSIUM: 3.7 mmol/L (ref 3.5–5.1)
Sodium: 138 mmol/L (ref 135–145)

## 2018-09-24 LAB — PREPARE RBC (CROSSMATCH)

## 2018-09-24 SURGERY — COLONOSCOPY WITH PROPOFOL
Anesthesia: Monitor Anesthesia Care

## 2018-09-24 MED ORDER — LIDOCAINE 2% (20 MG/ML) 5 ML SYRINGE
INTRAMUSCULAR | Status: DC | PRN
  Administered 2018-09-24: 40 mg via INTRAVENOUS

## 2018-09-24 MED ORDER — PROPOFOL 10 MG/ML IV BOLUS
INTRAVENOUS | Status: DC | PRN
  Administered 2018-09-24: 70 mg via INTRAVENOUS
  Administered 2018-09-24: 40 mg via INTRAVENOUS
  Administered 2018-09-24: 30 mg via INTRAVENOUS
  Administered 2018-09-24: 20 mg via INTRAVENOUS
  Administered 2018-09-24: 30 mg via INTRAVENOUS
  Administered 2018-09-24: 20 mg via INTRAVENOUS
  Administered 2018-09-24: 50 mg via INTRAVENOUS
  Administered 2018-09-24: 30 mg via INTRAVENOUS
  Administered 2018-09-24: 40 mg via INTRAVENOUS
  Administered 2018-09-24 (×2): 30 mg via INTRAVENOUS

## 2018-09-24 MED ORDER — SODIUM CHLORIDE 0.9 % IV SOLN: INTRAVENOUS | Status: DC | PRN

## 2018-09-24 MED ORDER — PHENYLEPHRINE 40 MCG/ML (10ML) SYRINGE FOR IV PUSH (FOR BLOOD PRESSURE SUPPORT)
PREFILLED_SYRINGE | INTRAVENOUS | Status: DC | PRN
  Administered 2018-09-24 (×2): 120 ug via INTRAVENOUS
  Administered 2018-09-24 (×2): 80 ug via INTRAVENOUS
  Administered 2018-09-24: 120 ug via INTRAVENOUS
  Administered 2018-09-24: 80 ug via INTRAVENOUS
  Administered 2018-09-24: 120 ug via INTRAVENOUS

## 2018-09-24 MED ORDER — SODIUM CHLORIDE 0.9% IV SOLUTION: Freq: Once | INTRAVENOUS | Status: DC

## 2018-09-24 MED ORDER — ALBUMIN HUMAN 5 % IV SOLN
INTRAVENOUS | Status: DC | PRN
  Administered 2018-09-24: 10:00:00 via INTRAVENOUS

## 2018-09-24 SURGICAL SUPPLY — 22 items

## 2018-09-24 NOTE — Progress Notes (Signed)
   Subjective:  Tatayana Beshears is a 73 y.o. with PMH of Roux-en-y gastric bypass, PCOS, A.fib, diverticulosis and psoriatic arthritis admit for GI bleed on hospital day 1  She was examined and evaluated at bedside this AM. States she continued to have bright red blood per rectum overnight. Denies any significant chest pain, dyspnea, or fatigue. Understands that she will undergo colonoscopy today. Denies any fever, chills, nausea, vomiting.  Objective:  Vital signs in last 24 hours: Vitals:   09/24/18 1037 09/24/18 1048 09/24/18 1240 09/24/18 1301  BP: 112/76 123/68 130/69 (!) 111/53  Pulse: 87 99 99 98  Resp: 19 (!) 21 16 14   Temp: 97.9 F (36.6 C)  98.2 F (36.8 C) 98 F (36.7 C)  TempSrc: Oral  Oral Oral  SpO2: 98% 100% 98% 98%  Weight:       Physical Exam  Constitutional: She is oriented to person, place, and time and well-developed, well-nourished, and in no distress. No distress.  Eyes:  Conjunctival pallor  Cardiovascular: Normal rate, regular rhythm, normal heart sounds and intact distal pulses.  Pulmonary/Chest: Effort normal and breath sounds normal. She has no wheezes. She has no rales.  Abdominal: Soft. Bowel sounds are normal. There is no abdominal tenderness. There is no guarding.  Musculoskeletal: Normal range of motion.        General: No edema.  Neurological: She is alert and oriented to person, place, and time.  Skin: Skin is warm and dry. There is pallor.   Assessment/Plan:  Active Problems:   Acute GI bleeding   Hematochezia   Status post colonoscopy with polypectomy   Proximal colon ulcer  Mrs.Silversmith is a 73 yo F w/ PMH of Roux-en-y gastric bypass, PCOS, A.fib, diverticulosis and psoriatic arthritis admit for GI bleed. She continued to have BRBPR overnight and her hemoglobin dropped to 6.2 and received 2 units of pRBCs overnight. Colonoscopy performed today shows ulcer at prior polypectomy site which has been clipped. Post-colonoscopy hemoglobin is  5.1 and she will be getting 2 additional units per GI.  BRBPR 2/2 cecal bleeding ulcer Bleeding at prior polypectomy site. Clipped with good hemostasis during colonoscopy. Currently getting 1/2 pRBCs placed this morning by GI - Appreciate GI recs: if hemoglobins table after transfusions, can d/c tomorrow - C/w home med: Protonix 40mg  daily - F/u post transfusion CBC - Transfuse if <7 - q12 hr CBCs  Persistent Atrial Fibrillation Shown on EKG. Rate controlled with metoprolol tartrate. Not on anticoagulation due to GI bleed. - Telemetry - C/w metoprolol tartrate 12.5mg  BID  Hx of Anxiety - C/w home med: fluoxetine 10mg  daily, clonazepam 0.5mg  daily PRN  DVT prophx: SCDs Diet: Clears Code: Full  Dispo: Anticipated discharge in approximately 1-2 day(s).   Mosetta Anis, MD 09/24/2018, 1:04 PM Pager: [phone]

## 2018-09-24 NOTE — Op Note (Signed)
[facility] Patient Name: Hannah Collier Procedure Date : 09/24/2018 MRN: [id number] Attending MD: Mauri Pole , MD Date of Birth: [birth year] CSN: [id number] Age: 73 Admit Type: Inpatient Procedure:                Colonoscopy Indications:              Hematochezia Providers:                Mauri Pole, MD, Zenon Mayo, RN, Charolette Child, Technician, Vania Rea, CRNA Referring MD:              Medicines:                Monitored Anesthesia Care Complications:            No immediate complications. Estimated Blood Loss:     Estimated blood loss was minimal. Procedure:                Pre-Anesthesia Assessment:                           - Prior to the procedure, a History and Physical                            was performed, and patient medications and                            allergies were reviewed. The patient's tolerance of                            previous anesthesia was also reviewed. The risks                            and benefits of the procedure and the sedation                            options and risks were discussed with the patient.                            All questions were answered, and informed consent                            was obtained. Prior Anticoagulants: The patient has                            taken no previous anticoagulant or antiplatelet                            agents. ASA Grade Assessment: III - A patient with                            severe systemic disease. After reviewing the risks  and benefits, the patient was deemed in                            satisfactory condition to undergo the procedure.                           After obtaining informed consent, the colonoscope                            was passed under direct vision. Throughout the                            procedure, the patient's blood pressure, pulse, and   oxygen saturations were monitored continuously. The                            PCF-H190DL ([id number]) Olympus pediatric colonoscope                            was introduced through the anus and advanced to the                            the terminal ileum, with identification of the                            appendiceal orifice and IC valve. The colonoscopy                            was performed without difficulty. The patient                            tolerated the procedure well. The quality of the                            bowel preparation was adequate. The terminal ileum,                            ileocecal valve, appendiceal orifice, and rectum                            were photographed. Scope In: 9:39:56 AM Scope Out: 10:29:59 AM Scope Withdrawal Time: 0 hours 35 minutes 49 seconds  Total Procedure Duration: 0 hours 50 minutes 3 seconds  Findings:      The perianal and digital rectal examinations were normal.      Red blood with large clots was found in the entire colon.      A single (solitary) five mm ulcer was found in the cecum at prior       polypectomy site. Spurting bleeding was present. Stigmata of recent       bleeding were present. Clot removal with suction was attempted. This was       successful and revealed a spurting lesion with a visible vessel. To stop       active bleeding, three hemostatic clips were successful (MR       conditional).and three hemostatic clips were misfired,  removed with roth       net. There was no bleeding at the end of the procedure.      Clean based ulcers at prior polypectomy site in ascending colon with no       bleeding. Impression:               - Blood in the entire examined colon.                           - A single (solitary) ulcer with visible vessel                            actively spurting blood in the cecum prior                            polypectomy site. Clips (MR conditional) were                            placed  with good hemostasis.                           - No specimens collected. Recommendation:           - Clear liquid diet today, then advance as                            tolerated to mechanical soft diet tomorrow for 1                            week.                           - Continue present medications.                           - No ibuprofen, naproxen, or other non-steroidal                            anti-inflammatory drugs for 2 weeks.                           - Monitor H&H q12h and transfuse as needed                           - If recurrent bleeding will need to consult IR for                            angio embolization                           - If Hgb remains stable, ok to discharge home                            tomorrow                           - Oral ferrous sulphate 325mg  TID  with meals on                            discharge                           - Repeat H&H in 1-2 weeks as outpatient and follow                            up with PMD                           - Follow up with GI as needed Procedure Code(s):        --- Professional ---                           [phone], Colonoscopy, flexible; with control of                            bleeding, any method Diagnosis Code(s):        --- Professional ---                           K92.2, Gastrointestinal hemorrhage, unspecified                           K63.3, Ulcer of intestine                           K92.1, Melena (includes Hematochezia) CPT copyright 2018 American Medical Association. All rights reserved. The codes documented in this report are preliminary and upon coder review may  be revised to meet current compliance requirements. Mauri Pole, MD 09/24/2018 10:49:06 AM This report has been signed electronically. Number of Addenda: 0

## 2018-09-24 NOTE — Anesthesia Postprocedure Evaluation (Signed)
Anesthesia Post Note  Patient: Hannah Collier  Procedure(s) Performed: COLONOSCOPY WITH PROPOFOL (N/A ) HEMOSTASIS CLIP PLACEMENT     Patient location during evaluation: Endoscopy Anesthesia Type: MAC Level of consciousness: awake and alert Pain management: pain level controlled Vital Signs Assessment: post-procedure vital signs reviewed and stable Respiratory status: spontaneous breathing, nonlabored ventilation, respiratory function stable and patient connected to nasal cannula oxygen Cardiovascular status: stable and blood pressure returned to baseline Postop Assessment: no apparent nausea or vomiting Anesthetic complications: no    Last Vitals:  Vitals:   09/24/18 1301 09/24/18 1622  BP: (!) 111/53 129/86  Pulse: 98 94  Resp: 14 14  Temp: 36.7 C 36.8 C  SpO2: 98% 97%    Last Pain:  Vitals:   09/24/18 1622  TempSrc: Oral  PainSc:                  Miriam Kestler COKER

## 2018-09-24 NOTE — Progress Notes (Addendum)
  Date: 09/24/2018  Patient name: Hannah Collier  Medical record number: [id number]  Date of birth: [birth year]   I have seen and evaluated Vilma Prader and discussed their care with the Residency Team. Briefly, Ms. Slingerland is a 73 year old woman with PMH of gastric bypass, PCOS, Afib, diverticulosis and psoriatic arthritis who presented with blood loss per rectum.  She noted that the stool started as black and tarry and progressed to bright red blood.  She was previously admitted earlier this month for a similar presentation, but she notes that this is much worse.  At that time she had a colonoscopy and EGD which showed mild esophageal stricture on EGD and 3 polyps removed on colonoscopy, one with need for hemostasis.  She further had left colon diverticulosis which did not show evidence of bleeding.  She was noted to have a drop in her blood counts overnight to 6.2 which was responded to with 2 units of blood.  GI is consulted and will take her back for colonoscopy today.    Vitals:   09/24/18 0432 09/24/18 0917  BP: 132/80 140/64  Pulse: 86 (!) 104  Resp: 20 (!) 24  Temp: 98.2 F (36.8 C) 98.5 F (36.9 C)  SpO2: 98% 95%   General: lying in bed, appears fatigues Eyes: Mild conjunctival pallor CV: Irreg Irreg, mildly tachycardic, no murmur Pulm: Breathing comfortably, no wheezing, RR normal Abd: NT, ND, +BS Ext: Trace edema to mid calf Skin: Pale appearing, no bruising Psych: Pleasant, normal mood.   Assessment and Plan: I have seen and evaluated the patient as outlined above. I agree with the formulated Assessment and Plan as detailed in the residents' note, with the following changes:   1. Lower GI bleed - She has diverticulosis, so could be diverticular.  Could also be due to polypectomy from 09/13/18.  - GI to see, colonoscopy today - Will need H/H checked post transfusion, reported that she received 2 units of blood overnight - Trend CBC, transfuse if < 7 - Continue  protonix - NPO x meds - Telemetry  2. Persistent Afib - No AC given active bleeding - controlled with metoprolol which will be continued  Other issues per Resident note.   Sid Falcon, MD 2/29/20209:53 AM

## 2018-09-24 NOTE — Transfer of Care (Signed)
Immediate Anesthesia Transfer of Care Note  Patient: Hannah Collier  Procedure(s) Performed: COLONOSCOPY WITH PROPOFOL (N/A ) HEMOSTASIS CLIP PLACEMENT  Patient Location: Endoscopy Unit  Anesthesia Type:MAC  Level of Consciousness: awake, oriented and patient cooperative  Airway & Oxygen Therapy: Patient Spontanous Breathing and Patient connected to nasal cannula oxygen  Post-op Assessment: Report given to RN and Post -op Vital signs reviewed and stable  Post vital signs: Reviewed and stable  Last Vitals:  Vitals Value Taken Time  BP 112/76 09/24/2018 10:37 AM  Temp    Pulse 87 09/24/2018 10:37 AM  Resp 19 09/24/2018 10:37 AM  SpO2 98 % 09/24/2018 10:37 AM    Last Pain:  Vitals:   09/24/18 1037  TempSrc:   PainSc: 0-No pain         Complications: No apparent anesthesia complications

## 2018-09-24 NOTE — Interval H&P Note (Signed)
History and Physical Interval Note:  09/24/2018 8:43 AM  Hannah Collier  has presented today for surgery, with the diagnosis of Hematochezia  The various methods of treatment have been discussed with the patient and family. After consideration of risks, benefits and other options for treatment, the patient has consented to  Procedure(s): COLONOSCOPY WITH PROPOFOL (N/A) as a surgical intervention .  The patient's history has been reviewed, patient examined, no change in status, stable for surgery.  I have reviewed the patient's chart and labs.  Questions were answered to the patient's satisfaction.     [name]

## 2018-09-24 NOTE — Anesthesia Preprocedure Evaluation (Signed)
Anesthesia Evaluation  Patient identified by MRN, date of birth, ID band Patient awake    Reviewed: Allergy & Precautions, NPO status , Patient's Chart, lab work & pertinent test results  Airway Mallampati: II  TM Distance: >3 FB Neck ROM: Full    Dental  (+) Teeth Intact   Pulmonary     + decreased breath sounds      Cardiovascular  Rhythm:Irregular     Neuro/Psych    GI/Hepatic   Endo/Other    Renal/GU      Musculoskeletal   Abdominal   Peds  Hematology   Anesthesia Other Findings   Reproductive/Obstetrics                             Anesthesia Physical Anesthesia Plan  ASA: IV and emergent  Anesthesia Plan: MAC   Post-op Pain Management:    Induction: Intravenous  PONV Risk Score and Plan: Ondansetron  Airway Management Planned: Simple Face Mask and Natural Airway  Additional Equipment:   Intra-op Plan:   Post-operative Plan:   Informed Consent: I have reviewed the patients History and Physical, chart, labs and discussed the procedure including the risks, benefits and alternatives for the proposed anesthesia with the patient or authorized representative who has indicated his/her understanding and acceptance.       Plan Discussed with: CRNA and Anesthesiologist  Anesthesia Plan Comments:         Anesthesia Quick Evaluation

## 2018-09-24 NOTE — Anesthesia Procedure Notes (Signed)
Procedure Name: MAC Date/Time: 09/24/2018 9:40 AM Performed by: Orlie Dakin, CRNA Pre-anesthesia Checklist: Patient identified, Emergency Drugs available, Suction available and Patient being monitored Patient Re-evaluated:Patient Re-evaluated prior to induction Oxygen Delivery Method: Nasal cannula Preoxygenation: Pre-oxygenation with 100% oxygen

## 2018-09-25 ENCOUNTER — Encounter (HOSPITAL_COMMUNITY): Payer: Self-pay | Admitting: Gastroenterology

## 2018-09-25 LAB — TYPE AND SCREEN
ABO/RH(D): A POS
Antibody Screen: NEGATIVE
Unit division: 0
Unit division: 0
Unit division: 0
Unit division: 0

## 2018-09-25 LAB — BPAM RBC
Blood Product Expiration Date: 202003262359
Blood Product Expiration Date: 202003272359
Blood Product Expiration Date: 202003272359
Blood Product Expiration Date: 202003272359
ISSUE DATE / TIME: 202002282305
ISSUE DATE / TIME: 202002290055
ISSUE DATE / TIME: 202002291245
ISSUE DATE / TIME: 202002291631
Unit Type and Rh: 6200
Unit Type and Rh: 6200
Unit Type and Rh: 6200
Unit Type and Rh: 6200

## 2018-09-25 LAB — CBC
HCT: 24.6 % — ABNORMAL LOW (ref 36.0–46.0)
HCT: 26.1 % — ABNORMAL LOW (ref 36.0–46.0)
Hemoglobin: 7.9 g/dL — ABNORMAL LOW (ref 12.0–15.0)
Hemoglobin: 8.6 g/dL — ABNORMAL LOW (ref 12.0–15.0)
MCH: 26.6 pg (ref 26.0–34.0)
MCH: 27.3 pg (ref 26.0–34.0)
MCHC: 32.1 g/dL (ref 30.0–36.0)
MCHC: 33 g/dL (ref 30.0–36.0)
MCV: 82.8 fL (ref 80.0–100.0)
MCV: 82.9 fL (ref 80.0–100.0)
Platelets: 133 10*3/uL — ABNORMAL LOW (ref 150–400)
Platelets: 138 10*3/uL — ABNORMAL LOW (ref 150–400)
RBC: 2.97 MIL/uL — ABNORMAL LOW (ref 3.87–5.11)
RBC: 3.15 MIL/uL — ABNORMAL LOW (ref 3.87–5.11)
RDW: 21 % — ABNORMAL HIGH (ref 11.5–15.5)
RDW: 21.1 % — ABNORMAL HIGH (ref 11.5–15.5)
WBC: 5.3 10*3/uL (ref 4.0–10.5)
WBC: 5.2 10*3/uL (ref 4.0–10.5)
nRBC: 0 % (ref 0.0–0.2)
nRBC: 0 % (ref 0.0–0.2)

## 2018-09-25 MED ORDER — FERROUS GLUCONATE 324 (38 FE) MG PO TABS: 324.0000 mg | ORAL_TABLET | Freq: Every day | ORAL | 3 refills | Status: DC

## 2018-09-25 NOTE — Progress Notes (Signed)
  Date: 09/25/2018  Patient name: Hannah Collier  Medical record number: [id number]  Date of birth: [birth year]   This patient's plan of care was discussed with the house staff. Please see Dr. Nelma Rothman note for complete details. I concur with his findings and plan.  Discharge today after IV iron as per GI.  Also after patient eats and tolerates a solid meal.  Per colonoscopy report, she will need soft diet for 1 week.  This will need to be communicated to the patient.     Sid Falcon, MD 09/25/2018, 11:49 AM

## 2018-09-25 NOTE — Progress Notes (Addendum)
   Subjective: Patient was resting comfortably in her bed today upon entering the room.  She initially denied bloody stool or any stool for that matter.  She denies abdominal pain, chest pain, lightheadedness, dizziness, weakness, or fatigue.  Later that morning she endorsed passage of several large dark foul-smelling blood clots via rectum which was witnessed by nursing staff who confirmed the description.  These are not bright red in description and consistent with old blood.  She continues to deny symptoms such as dizziness, lightheadedness, weakness, fatigue.  Objective:  Vital signs in last 24 hours: Vitals:   09/24/18 1623 09/24/18 1655 09/24/18 2050 09/25/18 0445  BP: 129/86 136/80 (!) 148/72 133/85  Pulse: 94 92 89 86  Resp: 14 14 20 18   Temp: 98.2 F (36.8 C) 98.6 F (37 C) 98.2 F (36.8 C) 98.5 F (36.9 C)  TempSrc: Oral Oral Oral   SpO2: 97% 98% 97% 92%  Weight:   94.6 kg   Height:    5\' 8"  (1.727 m)   General: A/O x4, in no acute distress, afebrile, nondiaphoretic Cardio: RRR, no mrg's Pulmonary: CTA bilaterally Abdominal: Abdomen is soft, nontender to palpation with normal bowel sounds MSK: BLE nontender, nonedematous Psych: Appropriate affect, not depressed in appearance, engages well  Assessment/Plan:  Active Problems:   Acute GI bleeding   Hematochezia   Status post colonoscopy with polypectomy   Proximal colon ulcer  Mrs. Dizdarevic is a 73 year old female with past medical history of PCOS, A. Fib not on anticoagulation due to GI bleed, Roux-en-Y gastric bypass, psoriatic arthritis, and mild hypertension who was admitted due to bright red blood per rectum.  She continued to endorse BRBPR overnight the first night of her admission and was found to have decreasing hemoglobin levels requiring PRBC transfusion x2 units initially.  Following endoscopy she was noted to have ulceration and hemorrhage at the prior polypectomy  excisional site which was clipped with  subsequent cessation of hemorrhage.  However, she required 2 additional units of PRBCs for stabilization of her hemoglobin.  Acute GI bleeding secondary to cecal bleeding ulcer: Ulcer has been clipped by GI.  We will continue to monitor the patient this morning for improvement in her symptoms and trend hemoglobin to determine stabilization with likely discharge home later today if she remains stable. Follow-up a.m. CBC was slightly decreased at 7.9 from 8.4 We will repeat stat CBC due to patients concern for additional blood loss although I suspect this is old blood from her prior bleed given the description and an anticipated part of the process as I previously advised her. If her Hgb remains stable likely consider discharge later today if GI is agreeable.  Transfuse for hemoglobin less than 7 Continue Protonix 40mg  daily  Persistent atrial fibrillation: Continues to be [place] on EKG rate controlled with metoprolol tartrate.  Not on anticoagulation due to continued gastrointestinal bleeding. Patient is to follow-up with cardiology.  DVT P PX: SCDs Diet: Advance as tolerated Code full Dispo: Anticipated discharge in approximately 0-1 day(s).   Kathi Ludwig, MD 09/25/2018, 6:53 AM Pager: Pager# [phone]

## 2018-09-25 NOTE — Discharge Instructions (Signed)
PLEASE inform the patient that she must eat a soft diet until she follows with GI.  Please call [phone] for rapid loss of bright red blood. You may notice the passage of some dark clotted blood for a few additional days. If this persits for longer than a week or you develop dizziness on standing, weakness, fatigue, or other symptoms please let us or your primary care office know. Our team may be reached through the clinic number at [phone].

## 2018-09-25 NOTE — Discharge Summary (Signed)
Name: Hannah Collier MRN: [id number] DOB: [birth year] 73 y.o. PCP: Patient, No Pcp Per  Date of Admission: 09/23/2018 11:23 AM Date of Discharge: 09/25/2018 Attending Physician: Sid Falcon, MD  Discharge Diagnosis: 1. GI bleed 2/2 cecal ulcer 2. Persistent Atrial Fibrillation  Discharge Medications: Allergies as of 09/25/2018      Reactions   Sulfa Antibiotics Rash      Medication List    TAKE these medications   clonazePAM 0.5 MG tablet Commonly known as:  KLONOPIN Take 1 tablet (0.5 mg total) by mouth daily as needed for anxiety.   diclofenac sodium 1 % Gel Commonly known as:  VOLTAREN Apply 2 g topically 4 (four) times daily.   ferrous gluconate 324 MG tablet Commonly known as:  FERGON Take 1 tablet (324 mg total) by mouth daily with breakfast.   FLUoxetine 10 MG capsule Commonly known as:  PROZAC Take 1 capsule (10 mg total) by mouth daily.   KLS ALLER-TEC 10 MG tablet Generic drug:  cetirizine Take 10 mg by mouth daily.   lidocaine 5 % Commonly known as:  LIDODERM Place 1 patch onto the skin as needed. Remove & Discard patch within 12 hours or as directed by MD   metoprolol tartrate 25 MG tablet Commonly known as:  LOPRESSOR Take 0.5 tablets (12.5 mg total) by mouth 2 (two) times daily.   pantoprazole 40 MG tablet Commonly known as:  PROTONIX Take 1 tablet (40 mg total) by mouth daily at 6 (six) AM.   triamcinolone cream 0.1 % Commonly known as:  KENALOG Apply 1 application topically 2 (two) times daily.       Disposition and follow-up:   [name] was discharged from [facility] in Stable condition.  At the hospital follow up visit please address:  1. GI bleed 2/2 cecal ulcer: - Assess for recurrence of GI bleed - Check CBC to ensure stabilized hemoglobin  2. Persistent Atrial Fibrillation - Ensure she follows up with cardiology - Will need anticoagulation to start once GI bleed has completely resolved  2.   Labs / imaging needed at time of follow-up: CBC  3.  Pending labs/ test needing follow-up: N/A  Follow-up Appointments: Follow-up Information    [place]. Schedule an appointment as soon as possible for a visit in 4 day(s).   Why:  For a hospital discharge follow-up. You may continue to see them if you wish.  Contact information: [address] [place] [facility] Course by problem list: 1. GI bleed 2/2 cecal ulcer: Hannah Collier is a 73 yo F w/ PMH of diverticulosis, atrial fibrillation, psoriatic arthritis and hx of alcohol use presenting with BRBPR. She had a recent admission for similar symptoms and had multiple polyps that were resected. Her hemoglobin dropped from 9.5 to 8.1 and she wad admitted. GI was consulted. Hemoglobin dropped further to 6.2 and she was given 2 units of pRBC. Colonoscopy was performed showing bleeding ulcer from prior polypectomy site. Bleeding site was clipped. She received additional 2 units of pRBCs as her hemoglobin was 5.1 post colonoscopy. Her hemoglobin 8.6 at discharge with GI follow up to be arranged per their team.   2. Persistent Atrial Fibrillation: Was recently diagnosed with A.fib on admission earlier this month. Rate controlled. Anticoagulation not started due to GI bleed. Was discharged with recommendation to f/u with cardiology.  Discharge Vitals:   BP 133/85 (BP  Location: Left Arm)   Pulse 97   Temp 98.6 F (37 C) (Oral)   Resp 18   Ht 5\' 8"  (1.727 m)   Wt 94.6 kg   SpO2 93%   BMI 31.71 kg/m   Pertinent Labs, Studies, and Procedures:  CBC Latest Ref Rng & Units 09/25/2018 09/25/2018 09/24/2018  WBC 4.0 - 10.5 K/uL 5.2 5.3 6.2  Hemoglobin 12.0 - 15.0 g/dL 8.6(L) 7.9(L) 8.4(L)  Hematocrit 36.0 - 46.0 % 26.1(L) 24.6(L) 25.6(L)  Platelets 150 - 400 K/uL 138(L) 133(L) 138(L)    Discharge Instructions: Discharge Instructions    Call MD for:  difficulty breathing,  headache or visual disturbances   Complete by:  As directed    Call MD for:  extreme fatigue   Complete by:  As directed    Call MD for:  persistant dizziness or light-headedness   Complete by:  As directed    Call MD for:  persistant nausea and vomiting   Complete by:  As directed    Diet - low sodium heart healthy   Complete by:  As directed    Increase activity slowly   Complete by:  As directed       Signed: Kathi Ludwig, MD 09/25/2018, 1:18 PM   Pager: @[phone] @

## 2018-09-25 NOTE — Progress Notes (Addendum)
     [facility] Progress Note   Chief Complaint:  Lower GI bleed     SUBJECTIVE:    feels okay, just had BM with dark blood.   ASSESSMENT AND PLAN:   1. Post-polypectomy bleed, s/p colonoscopy with clip placement for hemostasis yesterday. Just had first BM since colonoscopy and it contained dark blood blood ( I saw).  Suspect she is clearing old blood from bowel  2. ABL anemia. Hgb was 9.5  (recently hospitalized with diverticular bleed) but declined to 5.1 this admission. Received 4 uPRBC this admission but hgb rose only 3 grams.  -recommend IV iron prior to discharge, will order it -oral iron supplements at discharge.  -Will give solids for lunch. Safe for discharge from GI standpoint.  -Our office will call her with hospital follow up  3. AFib, new this admission. Has Cardiology follow up.   4. Abnormal U/S - mild CBD dilation  5. Abnormal liver tests, resolved. AST to ALT ratio suggested Etoh.    OBJECTIVE:     Vital signs in last 24 hours: Temp:  [97.9 F (36.6 C)-98.6 F (37 C)] 98.6 F (37 C) (03/01 0929) Pulse Rate:  [86-99] 97 (03/01 0929) Resp:  [14-21] 18 (03/01 0445) BP: (111-148)/(53-86) 133/85 (03/01 0445) SpO2:  [92 %-100 %] 93 % (03/01 0929) Weight:  [94.6 kg] 94.6 kg (02/29 2050) Last BM Date: 09/24/18 General:   Alert, well-developed female in NAD EENT:  Normal hearing, non icteric sclera, conjunctive pink.  Heart:  Regular rate, irreg rhythm.  No lower extremity edema   Pulm: Normal respiratory effort, lungs CTA bilaterally without wheezes or crackles. Abdomen:  Soft, nondistended, nontender.  Normal bowel sounds, no masses felt.       Neurologic:  Alert and  oriented x4;  grossly normal neurologically. Psych:  Pleasant, cooperative.  Normal mood and affect.   Intake/Output from previous day: 02/29 0701 - 03/01 0700 In: 1980 [P.O.:240; I.V.:600; Blood:890; IV Piggyback:250] Out: 0  Intake/Output this shift: Total I/O In:  480 [P.O.:480] Out: 0   Lab Results: Recent Labs    09/24/18 1115 09/24/18 2227 09/25/18 0609  WBC 4.3 6.2 5.3  HGB 5.1* 8.4* 7.9*  HCT 16.5* 25.6* 24.6*  PLT 125* 138* 133*   BMET Recent Labs    09/23/18 1134 09/24/18 0950 09/24/18 2227  NA 135 138 135  K 3.9 3.7 3.6  CL 102  --  105  CO2 23  --  23  GLUCOSE 95 95 100*  BUN 9  --  6*  CREATININE 0.98  --  0.93  CALCIUM 8.9  --  8.3*   LFT Recent Labs    09/23/18 1134  PROT 6.4*  ALBUMIN 3.3*  AST 41  ALT 21  ALKPHOS 71  BILITOT 1.8*      Active Problems:   Acute GI bleeding   Hematochezia   Status post colonoscopy with polypectomy   Proximal colon ulcer     LOS: 2 days   Tye Savoy ,NP 09/25/2018, 10:26 AM     Attending physician's note   I have taken an interval history, reviewed the chart and examined the patient. I agree with the Advanced Practitioner's note, impression and recommendations.   Feels much better Advance diet IV iron infusion Ok to discharge home if H&H is stable Follow up in office We will sign off, please call with anyquestions  Damaris Hippo , MD [phone]

## 2018-09-26 ENCOUNTER — Telehealth: Payer: Self-pay

## 2018-09-26 NOTE — Telephone Encounter (Signed)
Left her a message to call back to schedule her appointment.

## 2018-09-26 NOTE — Telephone Encounter (Signed)
-----   Message from Willia Craze, NP sent at 09/25/2018 10:53 AM EST ----- Eustaquio Maize, please get this patient a hospital follow up with me in 2 weeks. Thanks. Dx: post-polypectomy bleed

## 2018-09-29 ENCOUNTER — Other Ambulatory Visit: Payer: Self-pay | Admitting: Internal Medicine

## 2018-09-29 ENCOUNTER — Ambulatory Visit (INDEPENDENT_AMBULATORY_CARE_PROVIDER_SITE_OTHER): Payer: Medicare Other | Admitting: Internal Medicine

## 2018-09-29 ENCOUNTER — Other Ambulatory Visit: Payer: Self-pay

## 2018-09-29 VITALS — BP 139/86 | HR 72 | Temp 97.6°F | Ht 68.0 in | Wt 204.1 lb

## 2018-09-29 DIAGNOSIS — Z79899 Other long term (current) drug therapy: Secondary | ICD-10-CM | POA: Diagnosis not present

## 2018-09-29 DIAGNOSIS — Z9884 Bariatric surgery status: Secondary | ICD-10-CM

## 2018-09-29 DIAGNOSIS — K5790 Diverticulosis of intestine, part unspecified, without perforation or abscess without bleeding: Secondary | ICD-10-CM | POA: Diagnosis not present

## 2018-09-29 DIAGNOSIS — K922 Gastrointestinal hemorrhage, unspecified: Secondary | ICD-10-CM

## 2018-09-29 DIAGNOSIS — Z7289 Other problems related to lifestyle: Secondary | ICD-10-CM | POA: Diagnosis not present

## 2018-09-29 DIAGNOSIS — R5381 Other malaise: Secondary | ICD-10-CM

## 2018-09-29 DIAGNOSIS — D509 Iron deficiency anemia, unspecified: Secondary | ICD-10-CM | POA: Diagnosis not present

## 2018-09-29 MED ORDER — FERROUS SULFATE 325 (65 FE) MG PO TABS: 325.0000 mg | ORAL_TABLET | Freq: Every day | ORAL | 0 refills | Status: DC

## 2018-09-29 NOTE — Patient Instructions (Signed)
It was a pleasure to see you today Ms. Hannah Collier. Please make the following changes:  -Please start taking ferrous sulfate 325mg  daily that I have prescribed for you  -Avoid ibuprofen, naproxen, goody and bc powder -I will also setup an IV iron infusion for you   If you have any questions or concerns, please call our clinic at [phone] between 9am-5pm and after hours call [phone] and ask for the internal medicine resident on call. If you feel you are having a medical emergency please call 911.   Thank you, we look forward to help you remain healthy!  Lars Mage, MD Internal Medicine PGY2

## 2018-09-29 NOTE — Assessment & Plan Note (Signed)
The patient presents for hospital follow-up for admission from 09/23/2018 -09/25/2018 GI bleed.  Colonoscopy was done during this admission and showed 5 mm ulcer in cecum at prior polypectomy site.  The patient was recommended to eat clear liquid diet for 1 week, avoid NSAIDs for 2 weeks, follow-up with GI on an as-needed basis.  On day of discharge the patient's hemoglobin was 8.6 and she was told to take ferrous gluconate, but she was not able to do so because it was on back order at pharmacy.  During this visit the patient denies any nausea, vomiting, abdominal pain, dark-colored stools, dizziness, lightheadedness, chest pain.  She states that she has malaise.  Assessment and plan The patient's GI bleed appears to be stable.  However, the patient has malaise.  -Ordered CBC to check for improvement in hemoglobin -Change ferrous gluconate to ferrous sulfate 325 mg daily -We will order IV Feraheme infusion

## 2018-09-29 NOTE — Progress Notes (Signed)
   CC: Hospital follow-up for GI bleed  HPI:  [name] is a 73 y.o. with iron deficiency anemia, diverticulosis, gastric bypass in 2005, alcohol use disorder, and recent admission from 09/23/2018-09/25/2018 for GI bleed secondary to cecal ulcer presents for hospital follow-up regarding this. Please see problem based charting for evaluation, assessment, and plan.  Past Medical History:  Diagnosis Date  . Gastric bypass status for obesity   . Metabolic syndrome   . PCOS (polycystic ovarian syndrome)    Review of Systems:    Review of Systems  Constitutional: Negative for chills and fever.  Respiratory: Negative for cough.   Cardiovascular: Negative for chest pain.  Gastrointestinal: Negative for abdominal pain, nausea and vomiting.  Neurological: Negative for dizziness and headaches.   Physical Exam:  Vitals:   09/29/18 1421  BP: 139/86  Pulse: 72  Temp: 97.6 F (36.4 C)  TempSrc: Oral  SpO2: 100%  Weight: 204 lb 1.6 oz (92.6 kg)  Height: 5\' 8"  (1.727 m)   Physical Exam  Constitutional: Appears well-developed and well-nourished. No distress.  HENT:  Head: Normocephalic and atraumatic.  Eyes: Conjunctivae are normal.  Cardiovascular: Normal rate, regular rhythm and normal heart sounds.  Respiratory: Effort normal and breath sounds normal. No respiratory distress. No wheezes.   GI: Soft. Bowel sounds are normal. No distension. There is no tenderness.  Musculoskeletal: No edema.  Neurological: Is alert.  Skin: Not diaphoretic. No erythema. 1sec capillary refill Psychiatric: Normal mood and affect. Behavior is normal. Judgment and thought content normal.    Assessment & Plan:   See Encounters Tab for problem based charting.  Patient discussed with Dr. Dareen Piano

## 2018-09-30 ENCOUNTER — Other Ambulatory Visit: Payer: Self-pay | Admitting: Internal Medicine

## 2018-09-30 DIAGNOSIS — K922 Gastrointestinal hemorrhage, unspecified: Secondary | ICD-10-CM

## 2018-09-30 LAB — CBC
Hematocrit: 27.8 % — ABNORMAL LOW (ref 34.0–46.6)
Hemoglobin: 9.2 g/dL — ABNORMAL LOW (ref 11.1–15.9)
MCH: 28 pg (ref 26.6–33.0)
MCHC: 33.1 g/dL (ref 31.5–35.7)
MCV: 85 fL (ref 79–97)
PLATELETS: 174 10*3/uL (ref 150–450)
RBC: 3.28 x10E6/uL — ABNORMAL LOW (ref 3.77–5.28)
RDW: 18.8 % — ABNORMAL HIGH (ref 11.7–15.4)
WBC: 4.4 10*3/uL (ref 3.4–10.8)

## 2018-09-30 NOTE — Progress Notes (Signed)
Orders placed for feraheme  Hannah Mage, MD Internal Medicine PGY2 ELFYB:[phone] 09/30/2018, 1:05 PM

## 2018-09-30 NOTE — Progress Notes (Signed)
Internal Medicine Clinic Attending  Case discussed with Dr. Chundi at the time of the visit.  We reviewed the resident's history and exam and pertinent patient test results.  I agree with the assessment, diagnosis, and plan of care documented in the resident's note. 

## 2018-10-04 ENCOUNTER — Ambulatory Visit: Payer: Medicare Other | Admitting: Licensed Clinical Social Worker

## 2018-10-04 NOTE — Telephone Encounter (Signed)
Called patient and got her voicemail. Left message for the patient. Request she call us to schedule her hospital follow up appointment.

## 2018-10-19 NOTE — Telephone Encounter (Signed)
Patient has seen her PCP. She is scheduled for Feraheme. She did not return call to schedule an appointment with [facility].

## 2018-10-24 ENCOUNTER — Telehealth: Payer: Self-pay

## 2018-10-24 NOTE — Telephone Encounter (Signed)
TELEPHONE CALL NOTE  Hannah Collier has been deemed a candidate for a follow-up tele-health visit to limit community exposure during the Covid-19 pandemic. I spoke with the patient via phone to ensure availability of phone/video source, confirm preferred email & phone number, and discuss instructions and expectations.  I reminded Hannah Collier to be prepared with any vital sign and/or heart rhythm information that could potentially be obtained via home monitoring, at the time of her visit. I reminded Hannah Collier to expect a phone call at the time of her visit if her visit.  Did the patient verbally acknowledge consent to treatment? Patient provided verbal consent.  Hannah Collier, [place] 10/24/2018 4:52 PM   DOWNLOADING THE [place], go to [employer] and type in WebEx in the search bar. [place] [employer], the blue/green circle. The app is free but as with any other app downloads, their phone may require them to verify saved payment information or Apple password. The patient does NOT have to create an account.  - If Android, ask patient to go to [employer] and type in WebEx in the search bar. [place] [employer], the blue/green circle. The app is free but as with any other app downloads, their phone may require them to verify saved payment information or Android password. The patient does NOT have to create an account.   CONSENT FOR TELE-HEALTH VISIT - PLEASE REVIEW  I hereby voluntarily request, consent and authorize [facility] and its employed or contracted physicians, physician assistants, nurse practitioners or other licensed health care professionals (the Practitioner), to provide me with telemedicine health care services (the "Services") as deemed necessary by the treating Practitioner. I acknowledge and consent to receive the Services by the Practitioner via telemedicine. I understand that the telemedicine visit  will involve communicating with the Practitioner through live audiovisual communication technology and the disclosure of certain medical information by electronic transmission. I acknowledge that I have been given the opportunity to request an in-person assessment or other available alternative prior to the telemedicine visit and am voluntarily participating in the telemedicine visit.  I understand that I have the right to withhold or withdraw my consent to the use of telemedicine in the course of my care at any time, without affecting my right to future care or treatment, and that the Practitioner or I may terminate the telemedicine visit at any time. I understand that I have the right to inspect all information obtained and/or recorded in the course of the telemedicine visit and may receive copies of available information for a reasonable fee.  I understand that some of the potential risks of receiving the Services via telemedicine include:  Marland Kitchen Delay or interruption in medical evaluation due to technological equipment failure or disruption; . Information transmitted may not be sufficient (e.g. poor resolution of images) to allow for appropriate medical decision making by the Practitioner; and/or  . In rare instances, security protocols could fail, causing a breach of personal health information.  Furthermore, I acknowledge that it is my responsibility to provide information about my medical history, conditions and care that is complete and accurate to the best of my ability. I acknowledge that Practitioner's advice, recommendations, and/or decision may be based on factors not within their control, such as incomplete or inaccurate data provided by me or distortions of diagnostic images or specimens that may result from electronic transmissions. I understand that the practice of medicine is  not an Chief Strategy Officer and that Practitioner makes no warranties or guarantees regarding treatment outcomes. I acknowledge  that I will receive a copy of this consent concurrently upon execution via email to the email address I last provided but may also request a printed copy by calling the office of [name].    I understand that my insurance will be billed for this visit.   I have read or had this consent read to me. . I understand the contents of this consent, which adequately explains the benefits and risks of the Services being provided via telemedicine.  . I have been provided ample opportunity to ask questions regarding this consent and the Services and have had my questions answered to my satisfaction. . I give my informed consent for the services to be provided through the use of telemedicine in my medical care  By participating in this telemedicine visit I agree to the above.

## 2018-10-25 ENCOUNTER — Telehealth (INDEPENDENT_AMBULATORY_CARE_PROVIDER_SITE_OTHER): Payer: Medicare Other | Admitting: Cardiology

## 2018-10-25 ENCOUNTER — Encounter: Payer: Self-pay | Admitting: Cardiology

## 2018-10-25 VITALS — BP 118/75 | HR 75 | Ht 68.0 in | Wt 192.0 lb

## 2018-10-25 DIAGNOSIS — I4819 Other persistent atrial fibrillation: Secondary | ICD-10-CM | POA: Diagnosis not present

## 2018-10-25 DIAGNOSIS — I361 Nonrheumatic tricuspid (valve) insufficiency: Secondary | ICD-10-CM | POA: Insufficient documentation

## 2018-10-25 DIAGNOSIS — K921 Melena: Secondary | ICD-10-CM

## 2018-10-25 NOTE — Patient Instructions (Signed)
Medication Instructions:  Continue current medications  If you need a refill on your cardiac medications before your next appointment, please call your pharmacy.  Labwork: None Ordered   Testing/Procedures: None Ordered  Follow-Up: . Your physician recommends that you schedule a follow-up appointment in: 1 Month with Dr Percival Spanish   At [facility], you and your health needs are our priority.  As part of our continuing mission to provide you with exceptional heart care, we have created designated Provider Care Teams.  These Care Teams include your primary Cardiologist (physician) and Advanced Practice Providers (APPs -  Physician Assistants and Nurse Practitioners) who all work together to provide you with the care you need, when you need it.  Thank you for choosing [facility] at [facility]!!

## 2018-10-25 NOTE — Progress Notes (Signed)
Virtual Visit via Telephone Note    Evaluation Performed:  Follow-up visit  This visit type was conducted due to national recommendations for restrictions regarding the COVID-19 Pandemic (e.g. social distancing).  This format is felt to be most appropriate for this patient at this time.  All issues noted in this document were discussed and addressed.  No physical exam was performed (except for noted visual exam findings with Video Visits).  Please refer to the patient's chart (MyChart message for video visits and phone note for telephone visits) for the patient's consent to telehealth for [facility].  Date:  10/25/2018   ID:  Hannah Collier, DOB Jul 30, 1945, MRN [id number]  Patient Location:  Patients home  Provider location:   [place], [place]  PCP:  Mosetta Anis, MD  Cardiologist:  Minus Breeding, MD  Electrophysiologist:  None   Chief Complaint:  Atrial fib  History of Present Illness:    Hannah Collier is a 73 y.o. female who presents via audio/video conferencing for a telehealth visit today.    The patient was recently admitted with GI bleeding.  I reviewed these records for this visit.  She was noted to have colonic polyps.  These were resected.  She had rebleeding from the site of a resected colonic polyp with it was an ulceration that required clipping.  She required blood transfusions.  During this admission she was noted to be in atrial fibrillation.  She is never had this diagnosis before.  She did not notice that she was in it.  She denies any palpitations, presyncope or syncope other than when her hemoglobin was down to 6.2.  She said any dizziness or lightheadedness that she had improved with transfusion.  She apparently had reasonable rate control on the monitor.  She was not started on anticoagulation because of the recent bleed.  She denies any prior cardiac history other than what sounds like a catheterization perhaps 12 or 13 years ago.  She said this was done  because of an abnormal EKG.  She was told her arteries were normal.  She denies any chest pressure, neck or arm discomfort.  She had some mild shortness of breath when she was anemic but she is not describing any PND or orthopnea.   Dark stools   Late feb  Colonoscopy  Diverticulitis  Very low hgb   The patient does not have symptoms concerning for COVID-19 infection (fever, chills, cough, or new shortness of breath).    Prior CV studies:   The following studies were reviewed today:  EKG:    Atrial fibrillation, rate 71, axis within normal limits, intervals within normal limits, poor anterior R wave progression, low voltage in the limb leads, nonspecific diffuse T wave flattening.  September 23, 2018.    Reviewed hosp cecal ulcer   6.2    2 units   fibrillation, psoriatic arthritis and hx of alcohol use presenting with BRBPR. She had a recent admission for similar symptoms and had multiple polyps that were resected. Her hemoglobin dropped from 9.5 to 8.1 and she wad admitted. GI was consulted. Hemoglobin dropped further to 6.2 and she was given 2 units of pRBC. Colonoscopy was performed showing bleeding ulcer from prior polypectomy site. Bleeding site was clipped. She received additional 2 units of pRBCs as her hemoglobin was 5.1 post colonoscopy. Her hemoglobin 8.6 at discharge with GI follow up to be arranged per their team.   CARDIAC CATH YEARS AGO      ECHO: 09/13/2018 normal  left ventricular function.  No significant wall motion abnormalities.  Left atrium was severely dilated.  Right atrium was severely dilated.  There was moderate tricuspid regurgitation.  It was estimated the pulmonary pressures were moderately elevated.  Past Medical History:  Diagnosis Date  . Atrial fibrillation ([place])   . Gastric bypass status for obesity   . PCOS (polycystic ovarian syndrome)   . Tricuspid regurgitation    Past Surgical History:  Procedure Laterality Date  . COLONOSCOPY WITH PROPOFOL N/A  09/13/2018   Procedure: COLONOSCOPY WITH PROPOFOL;  Surgeon: Ladene Artist, MD;  Location: [facility] ENDOSCOPY;  Service: Endoscopy;  Laterality: N/A;  . COLONOSCOPY WITH PROPOFOL N/A 09/24/2018   Procedure: COLONOSCOPY WITH PROPOFOL;  Surgeon: Mauri Pole, MD;  Location: [facility] ENDOSCOPY;  Service: Endoscopy;  Laterality: N/A;  . ESOPHAGOGASTRODUODENOSCOPY (EGD) WITH PROPOFOL N/A 09/12/2018   Procedure: ESOPHAGOGASTRODUODENOSCOPY (EGD) WITH PROPOFOL;  Surgeon: Ladene Artist, MD;  Location: [facility] ENDOSCOPY;  Service: Endoscopy;  Laterality: N/A;  . POLYPECTOMY  09/13/2018   Procedure: POLYPECTOMY;  Surgeon: Ladene Artist, MD;  Location: [facility] ENDOSCOPY;  Service: Endoscopy;;  . REPLACEMENT TOTAL KNEE    . TONSILLECTOMY       Current Meds  Medication Sig  . cetirizine (KLS ALLER-TEC) 10 MG tablet Take 10 mg by mouth daily.  . clonazePAM (KLONOPIN) 0.5 MG tablet Take 1 tablet (0.5 mg total) by mouth daily as needed for anxiety.  . diclofenac sodium (VOLTAREN) 1 % GEL Apply 2 g topically 4 (four) times daily.  . ferrous sulfate 325 (65 FE) MG tablet Take 1 tablet (325 mg total) by mouth daily for 30 days.  Marland Kitchen FLUoxetine (PROZAC) 10 MG capsule Take 1 capsule (10 mg total) by mouth daily.  Marland Kitchen lidocaine (LIDODERM) 5 % Place 1 patch onto the skin as needed. Remove & Discard patch within 12 hours or as directed by MD  . metoprolol tartrate (LOPRESSOR) 25 MG tablet Take 0.5 tablets (12.5 mg total) by mouth 2 (two) times daily.  Marland Kitchen triamcinolone cream (KENALOG) 0.1 % Apply 1 application topically 2 (two) times daily.  . [DISCONTINUED] pantoprazole (PROTONIX) 40 MG tablet Take 1 tablet (40 mg total) by mouth daily at 6 (six) AM.     Allergies:   Sulfa antibiotics   Social History   Tobacco Use  . Smoking status: Never Smoker  . Smokeless tobacco: Never Used  Substance Use Topics  . Alcohol use: Yes  . Drug use: Not on file     Family Hx: The patient's family history includes COPD in her mother;  Throat cancer in her father.  ROS:   Please see the history of present illness.    Insomnia,  All other systems reviewed and are negative.   Labs/Other Tests and Data Reviewed:    Recent Labs: 09/13/2018: B Natriuretic Peptide 580.0 09/23/2018: ALT 21 09/24/2018: BUN 6; Creatinine, Ser 0.93; Potassium 3.6; Sodium 135 09/29/2018: Hemoglobin 9.2; Platelets 174   Recent Lipid Panel No results found for: CHOL, TRIG, HDL, CHOLHDL, LDLCALC, LDLDIRECT  Wt Readings from Last 3 Encounters:  10/25/18 192 lb (87.1 kg)  09/29/18 204 lb 1.6 oz (92.6 kg)  09/24/18 208 lb 8.9 oz (94.6 kg)     Objective:    Vital Signs:  BP 118/75   Pulse 75   Ht 5\' 8"  (1.727 m)   Wt 192 lb (87.1 kg)   BMI 29.19 kg/m     ASSESSMENT & PLAN:    PERSISTENT ATRIAL FIB:  She seems to have reasonable rate control.  At some point I would like to have a Holter to see if this is the case but for now she can keep her heart rate recorded at home.  I did send a message off to Dr. Fuller Plan who will see her eventually for the GI bleeding.  Ms. Xavier Fournier has a CHA2DS2 - VASc score of 2.   I think the most prudent thing at this point is to hold off on starting anticoagulation until we can be sure that we can safely draw blood and see her in follow-up.  And then a plan on touching base with her again in 1 month to see how she is doing and likely would start Eliquis at that point.  GI BLEED: As above  TR: The patient does seem to have tricuspid regurgitation with some elevated pulmonary pressures.  I will likely follow this up with repeat echocardiography.  She has no symptoms at this point.  We did discuss these findings.  COVID-19 Education: The signs and symptoms of COVID-19 were discussed with the patient and how to seek care for testing (follow up with PCP or arrange E-visit).  The importance of social distancing was discussed today.  Patient Risk:   After full review of this patient's clinical status, I feel that  they are at least moderate risk at this time.  Time:   Today, I have spent 25 minutes with the patient with telehealth technology discussing atrial fib, the hospital results and Covid 19.     Medication Adjustments/Labs and Tests Ordered: Current medicines are reviewed at length with the patient today.  Concerns regarding medicines are outlined above.  Tests Ordered: No orders of the defined types were placed in this encounter.  Medication Changes: No orders of the defined types were placed in this encounter.   Disposition:  Follow up one month  Signed, Minus Breeding, MD  10/25/2018 12:13 PM    [facility]

## 2018-10-26 ENCOUNTER — Telehealth: Payer: Self-pay

## 2018-10-26 NOTE — Telephone Encounter (Signed)
-----   Message from Ladene Artist, MD sent at 10/26/2018 12:05 PM EDT ----- Maylon Cos,   [place] from GI standpoint to start anticoagulation for afib 6 weeks from her last colonoscopy, which would be April 15th. This is actually Richardson Landry Armbruster's patient and I will have my office staff arrange GI follow up with him soon.    Thanks,  Norberto Sorenson     following her colonoscopy  ----- Message ----- From: Minus Breeding, MD Sent: 10/25/2018  12:06 PM EDT To: Ladene Artist, MD  Dalbert [facility]otte are going to see this patient as a new patient.  Can you look over her colonoscopy results and give me an estimation of how long you would wait.  I probably will not start for another month because I don't want to have to follow frequent blood work.  However, I would like to know my options.  Thanks. Maylon Cos

## 2018-10-26 NOTE — Telephone Encounter (Signed)
Left a message for patient to return my call. 

## 2018-10-28 ENCOUNTER — Telehealth: Payer: Medicare Other | Admitting: Cardiology

## 2018-10-28 NOTE — Telephone Encounter (Signed)
Pt requested a call back regarding appointment.

## 2018-10-28 NOTE — Telephone Encounter (Signed)
Left a message for patient to return my call. Mailed letter to patient to schedule follow up visit.

## 2018-10-28 NOTE — Telephone Encounter (Signed)
Left a message for patient to return my call. 

## 2018-11-01 ENCOUNTER — Telehealth: Payer: Self-pay | Admitting: *Deleted

## 2018-11-01 NOTE — Telephone Encounter (Signed)
lvm for patient to call opc to let us know the status of her getting the dermatology records to be given to rheumatology office so they can give appointment/ they waiting for her to get records.

## 2018-11-02 ENCOUNTER — Encounter: Payer: Self-pay | Admitting: Licensed Clinical Social Worker

## 2018-11-11 ENCOUNTER — Encounter: Payer: Self-pay | Admitting: *Deleted

## 2018-11-16 ENCOUNTER — Telehealth: Payer: Self-pay | Admitting: Licensed Clinical Social Worker

## 2018-11-16 NOTE — Addendum Note (Signed)
Addended by: Yvonna Alanis E on: 11/16/2018 11:10 AM   Modules accepted: Orders

## 2018-11-16 NOTE — Telephone Encounter (Signed)
Patient was contacted due to a referral from her PCP. Patient did not answer, and a voicemail was left for the patient to contact our office if she would like to schedule an appointment.

## 2018-11-30 ENCOUNTER — Ambulatory Visit: Payer: Medicare Other | Admitting: Licensed Clinical Social Worker

## 2018-11-30 ENCOUNTER — Telehealth: Payer: Self-pay | Admitting: Licensed Clinical Social Worker

## 2018-11-30 NOTE — Telephone Encounter (Signed)
Patient was contacted due to a scheduled appointment. Patient did not answer both calls. A voicemail was left for the patient to contact our office if she would like to reschedule.

## 2018-12-06 ENCOUNTER — Encounter: Payer: Self-pay | Admitting: Licensed Clinical Social Worker

## 2018-12-06 ENCOUNTER — Ambulatory Visit (INDEPENDENT_AMBULATORY_CARE_PROVIDER_SITE_OTHER): Payer: Medicare Other | Admitting: Licensed Clinical Social Worker

## 2018-12-06 DIAGNOSIS — F411 Generalized anxiety disorder: Secondary | ICD-10-CM

## 2018-12-06 NOTE — BH Specialist Note (Signed)
[place] Visit via Telemedicine (Telephone)  12/06/2018 Hannah Collier [id number]   Session Start time: 1:35  Session End time: 2:05 Total time: 30 minutes  Referring Provider: Dr. Maricela Bo Dr. Philipp Ovens Type of Visit: Telephonic Patient location: Home [facility] Provider location: Remote All persons participating in visit: patient, [facility], [facility] intern  Confirmed patient's address: Yes  Confirmed patient's phone number: Yes  Any changes to demographics: No   Discussed confidentiality: Yes    The following statements were read to the patient and/or legal guardian that are established with the [facility] Provider.  "The purpose of this phone visit is to provide behavioral health care while limiting exposure to the coronavirus (COVID19).  There is a possibility of technology failure and discussed alternative modes of communication if that failure occurs."  "By engaging in this telephone visit, you consent to the provision of healthcare.  Additionally, you authorize for your insurance to be billed for the services provided during this telephone visit."   Patient and/or legal guardian consented to telephone visit: Yes   PRESENTING CONCERNS: Patient and/or family reports the following symptoms/concerns: history of anxiety, lonely, and stress.  Duration of problem: long history, but increased over the past 3 years; Severity of problem: mild  STRENGTHS (Protective Factors/Coping Skills): Family support.   GOALS ADDRESSED: Patient will: 1.  Reduce symptoms of: anxiety and stress  2.  Increase knowledge and/or ability of: coping skills, healthy habits and stress reduction  3.  Demonstrate ability to: Increase healthy adjustment to current life circumstances, Increase adequate support systems for patient/family, Decrease self-medicating behaviors and Begin healthy grieving over loss  INTERVENTIONS: Interventions utilized:  Motivational Interviewing, Mindfulness or  Relaxation Training, Brief CBT and Supportive Counseling Standardized Assessments completed: assessed for SI, HI, and self-harm.  ASSESSMENT: Patient currently experiencing anxiety. Patient reported that she does not want therapy, but was willing to engage in order to get her medication. Patient reported that she was drinking to cope with her anxiety, but has stopped due health issues. Patient reported she has only drank twice since her hospitalization in March.   Patient reported that a 30 day prescription of the will last her around 45-60 days. Patient reported she takes them at night some days because it assist with her sleep.Patient reported without the medication she stays up most of the night, and watches television (occurring around 3 years). Patient identified that her thoughts increase at nighttime, and she cannot shut her brain off. Patient has a long history of anxiety. 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 Patient reported that she struggles with self-care, and this has been a problem most of her adult life.   Patient reported that she goes to dinner and on drives with her husband as a way to relax. Patient reported that her husband and her moved from [place] to [place] to be near her husband's son. Patient reported that she is very unhappy with living in [place], and is hoping to move.    Patient may benefit from bi-weekly counseling.  PLAN: 1. Follow up with behavioral health clinician on:   Dessie Coma, [facility], [place]

## 2018-12-07 NOTE — Progress Notes (Signed)
Dr. Maricela Bo you are not listed as the patient's pcp.  Did you mean for her to be seen by Dr. Truman Hayward?  If you would like her to be your patient please get in contact with Doris to be sure that is okay and just let me know.  Thanks,  [employer]

## 2018-12-07 NOTE — Progress Notes (Deleted)
Thanks for letting me know. You can tell patient she needs an appointment for that, a tele health appointment should be fine. We discussed that clonazepam would only be a short term prescription and she was aware of that when I prescribed it. I would have her schedule an appointment with a provider so it can be discussed, and other med changes can be made if needed. I'm glad she's agreed to see you again. Appreciate the update.

## 2018-12-13 NOTE — Addendum Note (Signed)
Addended by: Hulan Fray on: 12/13/2018 04:47 PM   Modules accepted: Orders

## 2018-12-14 ENCOUNTER — Telehealth: Payer: Self-pay | Admitting: *Deleted

## 2018-12-14 ENCOUNTER — Ambulatory Visit: Payer: Medicare Other | Admitting: Licensed Clinical Social Worker

## 2018-12-14 ENCOUNTER — Encounter: Payer: Self-pay | Admitting: Licensed Clinical Social Worker

## 2018-12-14 DIAGNOSIS — F411 Generalized anxiety disorder: Secondary | ICD-10-CM

## 2018-12-14 NOTE — Telephone Encounter (Signed)
OK 

## 2018-12-14 NOTE — Telephone Encounter (Signed)
Pt states she does not want to come to this practice, she wants a more structured practice where everyone knows what they are doing. She is wished well. Call ended

## 2018-12-14 NOTE — BH Specialist Note (Signed)
Integrated Behavioral Health Visit via Telemedicine (Telephone)  12/14/2018 Tonjia Parillo [id number]   Session Start time: 1:30  Session End time: 1:50 Total time: 20 minutes  Referring Provider: Dr. Philipp Ovens Type of Visit: Telephonic Patient location: Home  [facility] Provider location: Remote  All persons participating in visit: patient, [facility], and [facility] intern  Confirmed patient's address: Yes  Confirmed patient's phone number: Yes  Any changes to demographics: No   Discussed confidentiality: Yes    The following statements were read to the patient and/or legal guardian that are established with the [facility] Provider.  "The purpose of this phone visit is to provide behavioral health care while limiting exposure to the coronavirus (COVID19).  There is a possibility of technology failure and discussed alternative modes of communication if that failure occurs."  "By engaging in this telephone visit, you consent to the provision of healthcare.  Additionally, you authorize for your insurance to be billed for the services provided during this telephone visit."   Patient and/or legal guardian consented to telephone visit: Yes   PRESENTING CONCERNS: Patient and/or family reports the following symptoms/concerns: history anxiety, stress, and irritability. Sleep issues.  Duration of problem: long history; Severity of problem: moderate   GOALS ADDRESSED: Patient will: 1.  Reduce symptoms of: agitation, anxiety, insomnia and stress  2.  Increase knowledge and/or ability of: coping skills, healthy habits and stress reduction  3.  Demonstrate ability to: Increase healthy adjustment to current life circumstances, Increase adequate support systems for patient/family and Decrease self-medicating behaviors  INTERVENTIONS: Interventions utilized:  Motivational Interviewing, Medication Monitoring and Link to [employer] Standardized Assessments completed: Not  Needed  ASSESSMENT: Patient currently denying any symptoms of depression. Patient was irritable while on the phone.  Patient reported that she is not taking Prozac because she was only giving one referral, and not provided any direction on the next steps.  Patient reported this medication is not helpful, and she does not want a refill. Patient is requesting to be prescribed Klonopin, and reported she was on Klonopin for years in [place]. Patient is frustrated this prescription was not refilled after her original one month prescription. Patient is declining a referral to a psychiatrist at this time.   Patient has a history of consuming alcohol, and at her last appointment (12/06/18) reported only two episodes of consuming alcohol since March.   Patient is not interested in participating in therapy at this time.   PLAN: 1. Follow up with behavioral health clinician on : no follow up per the patient's request.  2. Referral(s): Psychiatrist. Referral was offered, but the patient declined.   Dessie Coma, [facility], [place]

## 2018-12-15 ENCOUNTER — Telehealth: Payer: Self-pay | Admitting: *Deleted

## 2018-12-15 ENCOUNTER — Other Ambulatory Visit: Payer: Self-pay

## 2018-12-15 NOTE — Telephone Encounter (Signed)
Called pt to see which medications she needs refills - no answer; left message to give [place] a call back.

## 2018-12-15 NOTE — Telephone Encounter (Signed)
-----   Message from [name], [facility] sent at 12/14/2018  2:33 PM EDT ----- Regarding: patient is requesting a phone call tomororw Hello.  I spoke with this patient earlier this afternoon.  She is requesting a phone call about her medication refills, and also her scheduled appointment with Dr. Truman Hayward in June.    Hannah Collier will not be home this afternoon, and reported she receive a phone call tomorrow morning.    Thank you for your help.  Miquel Dunn

## 2019-01-03 ENCOUNTER — Encounter: Payer: Medicare Other | Admitting: Internal Medicine

## 2019-04-28 DIAGNOSIS — L309 Dermatitis, unspecified: Secondary | ICD-10-CM | POA: Diagnosis not present

## 2019-04-28 DIAGNOSIS — L308 Other specified dermatitis: Secondary | ICD-10-CM | POA: Diagnosis not present

## 2019-04-28 DIAGNOSIS — Z23 Encounter for immunization: Secondary | ICD-10-CM | POA: Diagnosis not present

## 2019-05-11 DIAGNOSIS — Z79899 Other long term (current) drug therapy: Secondary | ICD-10-CM | POA: Diagnosis not present

## 2019-05-11 DIAGNOSIS — L259 Unspecified contact dermatitis, unspecified cause: Secondary | ICD-10-CM | POA: Diagnosis not present

## 2019-05-15 ENCOUNTER — Encounter (HOSPITAL_COMMUNITY): Payer: Self-pay | Admitting: Emergency Medicine

## 2019-05-15 ENCOUNTER — Other Ambulatory Visit: Payer: Self-pay

## 2019-05-15 ENCOUNTER — Telehealth (HOSPITAL_COMMUNITY): Payer: Self-pay

## 2019-05-15 ENCOUNTER — Emergency Department (HOSPITAL_COMMUNITY)
Admission: EM | Admit: 2019-05-15 | Discharge: 2019-05-15 | Disposition: A | Discharge status: Home / Self Care | Payer: Medicare Other | Attending: Emergency Medicine | Admitting: Emergency Medicine

## 2019-05-15 DIAGNOSIS — I4891 Unspecified atrial fibrillation: Secondary | ICD-10-CM

## 2019-05-15 DIAGNOSIS — R002 Palpitations: Secondary | ICD-10-CM | POA: Diagnosis not present

## 2019-05-15 DIAGNOSIS — Z79899 Other long term (current) drug therapy: Secondary | ICD-10-CM | POA: Insufficient documentation

## 2019-05-15 DIAGNOSIS — Z791 Long term (current) use of non-steroidal anti-inflammatories (NSAID): Secondary | ICD-10-CM | POA: Insufficient documentation

## 2019-05-15 DIAGNOSIS — I480 Paroxysmal atrial fibrillation: Secondary | ICD-10-CM | POA: Diagnosis not present

## 2019-05-15 DIAGNOSIS — I10 Essential (primary) hypertension: Secondary | ICD-10-CM | POA: Diagnosis not present

## 2019-05-15 DIAGNOSIS — Z96659 Presence of unspecified artificial knee joint: Secondary | ICD-10-CM | POA: Diagnosis not present

## 2019-05-15 LAB — BASIC METABOLIC PANEL
Anion gap: 15 (ref 5–15)
BUN: 5 mg/dL — ABNORMAL LOW (ref 8–23)
CO2: 28 mmol/L (ref 22–32)
Calcium: 10.2 mg/dL (ref 8.9–10.3)
Chloride: 87 mmol/L — ABNORMAL LOW (ref 98–111)
Creatinine, Ser: 0.86 mg/dL (ref 0.44–1.00)
GFR calc Af Amer: >60 mL/min (ref >60)
GFR calc non Af Amer: >60 mL/min (ref >60)
Glucose, Bld: 119 mg/dL — ABNORMAL HIGH (ref 70–99)
Potassium: 3.3 mmol/L — ABNORMAL LOW (ref 3.5–5.1)
Sodium: 130 mmol/L — ABNORMAL LOW (ref 135–145)

## 2019-05-15 LAB — MAGNESIUM: Magnesium: 2.1 mg/dL (ref 1.7–2.4)

## 2019-05-15 LAB — CBC
HCT: 43.3 % (ref 36.0–46.0)
Hemoglobin: 14.4 g/dL (ref 12.0–15.0)
MCH: 31.3 pg (ref 26.0–34.0)
MCHC: 33.3 g/dL (ref 30.0–36.0)
MCV: 94.1 fL (ref 80.0–100.0)
Platelets: 126 10*3/uL — ABNORMAL LOW (ref 150–400)
RBC: 4.6 MIL/uL (ref 3.87–5.11)
RDW: 15.2 % (ref 11.5–15.5)
WBC: 6.8 10*3/uL (ref 4.0–10.5)
nRBC: 0 % (ref 0.0–0.2)

## 2019-05-15 MED ORDER — LORAZEPAM 2 MG/ML IJ SOLN
0.5000 mg | Freq: Once | INTRAMUSCULAR | Status: AC
  Administered 2019-05-15: 0.5 mg via INTRAVENOUS
  Filled 2019-05-15: qty 1

## 2019-05-15 MED ORDER — METOPROLOL TARTRATE 5 MG/5ML IV SOLN
5.0000 mg | INTRAVENOUS | Status: DC | PRN
  Administered 2019-05-15 (×2): 5 mg via INTRAVENOUS
  Filled 2019-05-15 (×2): qty 5

## 2019-05-15 MED ORDER — SODIUM CHLORIDE 0.9 % IV BOLUS
500.0000 mL | Freq: Once | INTRAVENOUS | Status: AC
  Administered 2019-05-15: 500 mL via INTRAVENOUS

## 2019-05-15 MED ORDER — METOPROLOL TARTRATE 25 MG PO TABS
12.5000 mg | ORAL_TABLET | Freq: Once | ORAL | Status: AC
  Administered 2019-05-15: 12.5 mg via ORAL
  Filled 2019-05-15: qty 1

## 2019-05-15 MED ORDER — LORAZEPAM 0.5 MG PO TABS: 0.5000 mg | ORAL_TABLET | Freq: Three times a day (TID) | ORAL | 0 refills | Status: DC | PRN

## 2019-05-15 MED ORDER — METOPROLOL TARTRATE 25 MG PO TABS: 12.5000 mg | ORAL_TABLET | Freq: Two times a day (BID) | ORAL | 0 refills | Status: DC

## 2019-05-15 NOTE — ED Triage Notes (Signed)
Patient c/o anxiety of the past few days causing her to feel nauseas and have lack of appetite. Patient states she has had this rash since June and feels like it has not improved. Patient also has had a tremor for past 40 years but feels like it has worsened.

## 2019-05-15 NOTE — Telephone Encounter (Signed)
Reached out to pt to schedule ED f/u. Pt states that she will reach out to her cardiologist in the AM.

## 2019-05-15 NOTE — ED Provider Notes (Signed)
[facility] EM[facility]GENCY DEPARTMENT Provider Note   CSN: [id number] Arrival date & time: 05/15/19  1043     History   Chief Complaint Chief Complaint  Patient presents with  . Palpitations    HPI Hannah Collier is a 73 y.o. female.     Patient with history of persistent atrial fibrillation, no anticoagulation -- presents with complaint of palpitations described as pounding in the chest. Also reports tremors. Symptoms started yesterday abruptly while on the phone.  Symptoms lasted for about 90 minutes.  They have been waxing and waning since that time.  Patient reports being on metoprolol for her A. fib in the past however has not been on anything recently.  Patient reports that she has also had a rash since June that is being treated and evaluated by her dermatologist.  She states that she had a biopsy that returned as contact dermatitis.  She was given a steroid shot last week.  No fevers, chest pain, shortness of breath, cough.  No nausea, vomiting, diarrhea.  Patient had previously been on Klonopin.  Onset of symptoms acute.  Course is constant.     Past Medical History:  Diagnosis Date  . Atrial fibrillation ([place])   . Gastric bypass status for obesity   . PCOS (polycystic ovarian syndrome)   . Tricuspid regurgitation     Patient Active Problem List   Diagnosis Date Noted  . Gastrointestinal hemorrhage with melena 10/25/2018  . 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 Hematochezia   . Status post colonoscopy with polypectomy   . Proximal colon ulcer   . History of gastric bypass 09/21/2018  . Generalized anxiety disorder 09/21/2018  . Transaminitis 09/21/2018  . Benign neoplasm of ascending colon   . Benign neoplasm of cecum   . Iron deficiency anemia   . Symptomatic anemia   . Acute GI bleeding 09/11/2018  . New onset a-fib ([place]) 09/11/2018  . Psoriatic arthritis ([place]) 09/11/2018  . Acute blood loss anemia 09/11/2018    Past  Surgical History:  Procedure Laterality Date  . COLONOSCOPY WITH PROPOFOL N/A 09/13/2018   Procedure: COLONOSCOPY WITH PROPOFOL;  Surgeon: Ladene Artist, MD;  Location: [facility] ENDOSCOPY;  Service: Endoscopy;  Laterality: N/A;  . COLONOSCOPY WITH PROPOFOL N/A 09/24/2018   Procedure: COLONOSCOPY WITH PROPOFOL;  Surgeon: Mauri Pole, MD;  Location: [facility] ENDOSCOPY;  Service: Endoscopy;  Laterality: N/A;  . ESOPHAGOGASTRODUODENOSCOPY (EGD) WITH PROPOFOL N/A 09/12/2018   Procedure: ESOPHAGOGASTRODUODENOSCOPY (EGD) WITH PROPOFOL;  Surgeon: Ladene Artist, MD;  Location: [facility] ENDOSCOPY;  Service: Endoscopy;  Laterality: N/A;  . POLYPECTOMY  09/13/2018   Procedure: POLYPECTOMY;  Surgeon: Ladene Artist, MD;  Location: [facility] ENDOSCOPY;  Service: Endoscopy;;  . REPLACEMENT TOTAL KNEE    . TONSILLECTOMY       OB History   No obstetric history on file.      Home Medications    Prior to Admission medications   Medication Sig Start Date End Date Taking? Authorizing Provider  cetirizine (KLS ALLER-TEC) 10 MG tablet Take 10 mg by mouth daily.    [provider]  clonazePAM (KLONOPIN) 0.5 MG tablet Take 1 tablet (0.5 mg total) by mouth daily as needed for anxiety. 09/21/18 09/21/19  Velna Ochs, MD  diclofenac sodium (VOLTAREN) 1 % GEL Apply 2 g topically 4 (four) times daily. 09/13/18   [name], MD  ferrous sulfate 325 (65 FE) MG tablet Take 1 tablet (325 mg total) by mouth daily for 30 days.  09/29/18 10/29/18  Lars Mage, MD  FLUoxetine (PROZAC) 10 MG capsule Take 1 capsule (10 mg total) by mouth daily. 09/21/18 09/21/19  Velna Ochs, MD  lidocaine (LIDODERM) 5 % Place 1 patch onto the skin as needed. Remove & Discard patch within 12 hours or as directed by MD 09/13/18   [name], MD  metoprolol tartrate (LOPRESSOR) 25 MG tablet Take 0.5 tablets (12.5 mg total) by mouth 2 (two) times daily. 09/21/18 09/21/19  Velna Ochs, MD  triamcinolone cream (KENALOG) 0.1 %  Apply 1 application topically 2 (two) times daily. 06/19/17   Wynona Luna, MD    Family History Family History  Problem Relation Age of Onset  . COPD Mother        Did not know her history  . Throat cancer Father     Social History Social History   Tobacco Use  . Smoking status: Never Smoker  . Smokeless tobacco: Never Used  Substance Use Topics  . Alcohol use: Yes  . Drug use: Not on file     Allergies   Sulfa antibiotics   Review of Systems Review of Systems  Constitutional: Negative for diaphoresis and fever.  Eyes: Negative for redness.  Respiratory: Negative for cough and shortness of breath.   Cardiovascular: Positive for palpitations. Negative for chest pain and leg swelling.  Gastrointestinal: Negative for abdominal pain, nausea and vomiting.  Genitourinary: Negative for dysuria.  Musculoskeletal: Negative for back pain and neck pain.  Skin: Negative for rash.  Neurological: Positive for tremors. Negative for syncope and light-headedness.  Psychiatric/Behavioral: The patient is nervous/anxious.      Physical Exam Updated Vital Signs BP (!) 178/100   Pulse (!) 148   Temp 98 F (36.7 C) (Oral)   Resp 20   SpO2 95%   Physical Exam Vitals signs and nursing note reviewed.  Constitutional:      Appearance: She is well-developed. She is not diaphoretic.  HENT:     Head: Normocephalic and atraumatic.     Mouth/Throat:     Mouth: Mucous membranes are moist.  Eyes:     Conjunctiva/sclera: Conjunctivae normal.  Neck:     Musculoskeletal: Normal range of motion and neck supple. No muscular tenderness.     Vascular: Normal carotid pulses. No carotid bruit or JVD.     Trachea: Trachea normal. No tracheal deviation.  Cardiovascular:     Rate and Rhythm: Tachycardia present. Rhythm irregular.     Pulses: No decreased pulses.     Heart sounds: Normal heart sounds, S1 normal and S2 normal. No murmur.  Pulmonary:     Effort: Pulmonary effort is  normal. No respiratory distress.     Breath sounds: No wheezing.  Chest:     Chest wall: No tenderness.  Abdominal:     General: Bowel sounds are normal.     Palpations: Abdomen is soft.     Tenderness: There is no abdominal tenderness. There is no guarding or rebound.  Musculoskeletal: Normal range of motion.  Skin:    General: Skin is warm and dry.     Coloration: Skin is not pale.     Findings: Rash present.     Comments: Diffuse maculopapular rash over most of her body especially her torso and extremities.  Neurological:     Mental Status: She is alert.      ED Treatments / Results  Labs (all labs ordered are listed, but only abnormal results are displayed) Labs Reviewed  BASIC METABOLIC  PANEL - Abnormal; Notable for the following components:      Result Value   Sodium 130 (*)    Potassium 3.3 (*)    Chloride 87 (*)    Glucose, Bld 119 (*)    BUN 5 (*)    All other components within normal limits  CBC - Abnormal; Notable for the following components:   Platelets 126 (*)    All other components within normal limits  MAGNESIUM    ED ECG REPORT   Date: 05/15/2019  Rate: 157  Rhythm: atrial fibrillation  QRS Axis: left  Intervals: normal  ST/T Wave abnormalities: normal  Conduction Disutrbances:none  Narrative Interpretation:   Old EKG Reviewed: changes noted, faster today  I have personally reviewed the EKG tracing and agree with the computerized printout as noted.  Radiology No results found.  Procedures Procedures (including critical care time)  Medications Ordered in ED Medications  metoprolol tartrate (LOPRESSOR) injection 5 mg (5 mg Intravenous Given 05/15/19 1251)  LORazepam (ATIVAN) injection 0.5 mg (0.5 mg Intravenous Given 05/15/19 1202)  sodium chloride 0.9 % bolus 500 mL (0 mLs Intravenous Stopped 05/15/19 1254)  metoprolol tartrate (LOPRESSOR) tablet 12.5 mg (12.5 mg Oral Given 05/15/19 1359)     Initial Impression / Assessment and Plan  / ED Course  I have reviewed the triage vital signs and the nursing notes.  Pertinent labs & imaging results that were available during my care of the patient were reviewed by me and considered in my medical decision making (see chart for details).        Reviewed records and EKG. Pt in afib with RVR.  She looks well on exam and is stable.  Will give metoprolol and reassess.  Will check lab work.  No signs of failure or ACS at time of exam.  Patient will need to be on a rate controlling medication.  Discussed with patient at bedside.  Vital signs reviewed and are as follows: BP (!) 178/100   Pulse (!) 148   Temp 98 F (36.7 C) (Oral)   Resp 20   SpO2 95%   This patients CHA2DS2-VASc Score and unadjusted Ischemic Stroke Rate (% per year) is equal to 2.2 % stroke rate/year from a score of 2  Above score calculated as 1 point each if present [CHF, HTN, DM, Vascular=MI/PAD/Aortic Plaque, Age if 65-74, or Female] Above score calculated as 2 points each if present [Age > 75, or Stroke/TIA/TE]   2:10 PM Patient discussed with and seen by Dr. Ashok Cordia.  Patient states that she feels much better with control of her heart rate.  Will re-start on metoprolol 12.5mg  bid.  She will follow up with her cardiologist, Dr. Percival Spanish.   Discussed need to follow-up to determine if she should be on anticoagulation long-term to help prevent stroke.  Discussed that this is a decision which will need input from her cardiologist and GI physician.   Encouraged return to the emergency department if she develops chest pain, shortness of breath, feels lightheaded or passes out.  We will give short course of lorazepam for anxiety.  Encouraged PCP follow-up to discuss continuing medications for anxiety.  CRITICAL CARE Performed by: Carlisle Cater PA-C Total critical care time: 40 minutes Critical care time was exclusive of separately billable procedures and treating other patients. Critical care was necessary to treat  or prevent imminent or life-threatening deterioration. Critical care was time spent personally by me on the following activities: development of treatment plan with patient and/or  surrogate as well as nursing, discussions with consultants, evaluation of patient's response to treatment, examination of patient, obtaining history from patient or surrogate, ordering and performing treatments and interventions, ordering and review of laboratory studies, ordering and review of radiographic studies, pulse oximetry and re-evaluation of patient's condition.   Final Clinical Impressions(s) / ED Diagnoses   Final diagnoses:  Atrial fibrillation with RVR ([place])  Hypertension, unspecified type   A. fib with RVR, controlled with IV metoprolol.  No signs of failure.  Symptoms improved with control of rate.  Treatment as above.  Defer anticoagulation decision to patient's cardiologist.  Note sent.  Patient seems reliable to follow-up.  No signs of ACS. ?Steroid shot given last week contributing?  ED Discharge Orders         Ordered    Amb referral to AFIB Clinic     05/15/19 1339    metoprolol tartrate (LOPRESSOR) 25 MG tablet  2 times daily     05/15/19 1415    LORazepam (ATIVAN) 0.5 MG tablet  3 times daily PRN     05/15/19 1416           Carlisle Cater, PA-C 05/15/19 1436    Lajean Saver, MD 05/15/19 1445

## 2019-05-15 NOTE — Discharge Instructions (Signed)
Please read and follow all provided instructions.  Your diagnoses today include:  1. Atrial fibrillation with RVR ([place])   2. Hypertension, unspecified type     Tests performed today include:  An EKG of your heart - shows fast heart rate  Blood counts and electrolytes  Vital signs. See below for your results today.   Medications prescribed:   Metoprolol -medication to control heart rate as well as blood pressure   Ativan - anti-anxiety medication  DO NOT drive or perform any activities that require you to be awake and alert because this medicine can make you drowsy.   Take any prescribed medications only as directed.  Follow-up instructions: Please follow-up with your primary care provider and cardiologist as soon as you can for further evaluation of your symptoms.   Return instructions:  SEEK IMMEDIATE MEDICAL ATTENTION IF:  You have severe chest pain, especially if the pain is crushing or pressure-like and spreads to the arms, back, neck, or jaw, or if you have sweating, nausea (feeling sick to your stomach), or shortness of breath. THIS IS AN EMERGENCY. Don't wait to see if the pain will go away. Get medical help at once. Call 911 or 0 (operator). DO NOT drive yourself to the hospital.   Your chest pain gets worse and does not go away with rest.   You have an attack of chest pain lasting longer than usual, despite rest and treatment with the medications your caregiver has prescribed.   You wake from sleep with chest pain or shortness of breath.  You feel dizzy or faint.  You have chest pain not typical of your usual pain for which you originally saw your caregiver.   You have any other emergent concerns regarding your health.  Your vital signs today were: BP (!) 164/96    Pulse 91    Temp 98 F (36.7 C) (Oral)    Resp (!) 26    Ht 5\' 8"  (1.727 m)    Wt 84.8 kg    SpO2 96%    BMI 28.43 kg/m  If your blood pressure (BP) was elevated above 135/85 this visit, please  have this repeated by your doctor within one month. --------------

## 2019-05-16 NOTE — Progress Notes (Addendum)
Cardiology Office Note   Date:  05/18/2019   ID:  Hannah Collier, DOB June 06, 1946, MRN [id number]  PCP:  Gaynelle Arabian, MD  Cardiologist:   Minus Breeding, MD   Chief Complaint  Patient presents with  . Atrial Fibrillation      History of Present Illness: Hannah Collier is a 73 y.o. female who presents for follow up of atrial fib.  She was in the ED for this recently.  I reviewed these records for this visit.   She was treated with IV metoprolol.   I saw her in March for atrial fib.   She has a history of GI bleeding.  She had she was noted to have colonic polyps.  These were resected.  She had rebleeding from the site of a resected colonic polyp with it was an ulceration that required clipping.  She required blood transfusions.  During this admission in Feb she was noted to be in atrial fibrillation.  She is never had this diagnosis before.  She did not notice that she was in it. I did not start anticoagulation at this point and she cancelled a follow up appt virtually the next month.    She said that the day she presented to the emergency room she felt her hand shaking.  Her heart was pounding.  She felt weak and sweaty.  She had a little bit of cloudy vision.  She has been off of her beta-blocker because it is not been renewed by her primary provider and she had come back for follow-up.  She says her heart rate now that she is back on low-dose runs above 100 routinely.  She really feels dizzy and has to be very particular about how she takes her beta-blocker so increasing the dose is slightly problematic.  She did have 1 fall earlier in the year on the beta-blocker.  She is under stress caring for her husband who has chronic illnesses.  She denies any chest pressure, neck or arm discomfort.    Past Medical History:  Diagnosis Date  . Atrial fibrillation ([place])   . Gastric bypass status for obesity   . PCOS (polycystic ovarian syndrome)   . Tricuspid regurgitation     Past  Surgical History:  Procedure Laterality Date  . COLONOSCOPY WITH PROPOFOL N/A 09/13/2018   Procedure: COLONOSCOPY WITH PROPOFOL;  Surgeon: Ladene Artist, MD;  Location: [facility] ENDOSCOPY;  Service: Endoscopy;  Laterality: N/A;  . COLONOSCOPY WITH PROPOFOL N/A 09/24/2018   Procedure: COLONOSCOPY WITH PROPOFOL;  Surgeon: Mauri Pole, MD;  Location: [facility] ENDOSCOPY;  Service: Endoscopy;  Laterality: N/A;  . ESOPHAGOGASTRODUODENOSCOPY (EGD) WITH PROPOFOL N/A 09/12/2018   Procedure: ESOPHAGOGASTRODUODENOSCOPY (EGD) WITH PROPOFOL;  Surgeon: Ladene Artist, MD;  Location: [facility] ENDOSCOPY;  Service: Endoscopy;  Laterality: N/A;  . POLYPECTOMY  09/13/2018   Procedure: POLYPECTOMY;  Surgeon: Ladene Artist, MD;  Location: [facility] ENDOSCOPY;  Service: Endoscopy;;  . REPLACEMENT TOTAL KNEE    . TONSILLECTOMY       Current Outpatient Medications  Medication Sig Dispense Refill  . cetirizine (KLS ALLER-TEC) 10 MG tablet Take 10 mg by mouth daily.    Marland Kitchen LORazepam (ATIVAN) 0.5 MG tablet Take 1 tablet (0.5 mg total) by mouth 3 (three) times daily as needed for anxiety. 10 tablet 0  . metoprolol tartrate (LOPRESSOR) 25 MG tablet Take 0.5 tablets (12.5 mg total) by mouth 2 (two) times daily. 30 tablet 0  . triamcinolone cream (KENALOG) 0.1 % Apply 1 application  topically 2 (two) times daily. 453.6 g 0  . apixaban (ELIQUIS) 5 MG TABS tablet Take 1 tablet (5 mg total) by mouth 2 (two) times daily. 180 tablet 3  . clonazePAM (KLONOPIN) 0.5 MG tablet Take 1 tablet (0.5 mg total) by mouth daily as needed for anxiety. (Patient not taking: Reported on 05/18/2019) 30 tablet 0  . diclofenac sodium (VOLTAREN) 1 % GEL Apply 2 g topically 4 (four) times daily. (Patient not taking: Reported on 05/18/2019) 100 g 0  . digoxin (LANOXIN) 0.125 MG tablet Take 1 tablet (0.125 mg total) by mouth daily. 90 tablet 3  . ferrous sulfate 325 (65 FE) MG tablet Take 1 tablet (325 mg total) by mouth daily for 30 days. 30 tablet 0  .  FLUoxetine (PROZAC) 10 MG capsule Take 1 capsule (10 mg total) by mouth daily. (Patient not taking: Reported on 05/18/2019) 30 capsule 1  . lidocaine (LIDODERM) 5 % Place 1 patch onto the skin as needed. Remove & Discard patch within 12 hours or as directed by MD (Patient not taking: Reported on 05/18/2019) 30 patch 0   No current facility-administered medications for this visit.     Allergies:   Sulfa antibiotics     ROS:  Please see the history of present illness.   Otherwise, review of systems are positive for none.   All other systems are reviewed and negative.    PHYSICAL EXAM: VS:  BP (!) 160/10   Pulse 91   Temp (!) 96.3 F (35.7 C)   Ht 5\' 8"  (1.727 m)   Wt 190 lb 6.4 oz (86.4 kg)   SpO2 98%   BMI 28.95 kg/m  , BMI Body mass index is 28.95 kg/m. GENERAL:  Well appearing NECK:  No jugular venous distention, waveform within normal limits, carotid upstroke brisk and symmetric, no bruits, no thyromegaly LUNGS:  Clear to auscultation bilaterally CHEST:  Unremarkable HEART:  PMI not displaced or sustained,S1 and S2 within normal limits, no S3, no clicks, no rubs, no murmurs, irregular  ABD:  Flat, positive bowel sounds normal in frequency in pitch, no bruits, no rebound, no guarding, no midline pulsatile mass, no hepatomegaly, no splenomegaly EXT:  2 plus pulses throughout, no edema, no cyanosis no clubbing SKIN:  Rash   EKG:  EKG is not ordered today. The ekg ordered 05/15/2019 demonstrates atrial fibrillation with rapid ventricular response, poor anterior R wave progression   Recent Labs: 09/13/2018: B Natriuretic Peptide 580.0 09/23/2018: ALT 21 05/15/2019: BUN 5; Creatinine, Ser 0.86; Hemoglobin 14.4; Magnesium 2.1; Platelets 126; Potassium 3.3; Sodium 130    Lipid Panel No results found for: CHOL, TRIG, HDL, CHOLHDL, VLDL, LDLCALC, LDLDIRECT    Wt Readings from Last 3 Encounters:  05/18/19 190 lb 6.4 oz (86.4 kg)  05/15/19 187 lb (84.8 kg)  10/25/18 192 lb  (87.1 kg)      Other studies Reviewed: Additional studies/ records that were reviewed today include: ED records. Review of the above records demonstrates:  Please see elsewhere in the note.     ASSESSMENT AND PLAN:  PERSISTENT ATRIAL FIB:     She is going to be bck on anticoagulation.  Is been a long time since she had her bleeding and I think she should be low risk.   I will use Eliquis 5 mg twice daily.  She can come back in a couple weeks and get a CBC.  I may try digoxin at a low dose to try to reduce her heart rate  but she will let me know if her heart rates going up and I would have to try to increase the beta-blocker.  We had a long discussion about this.  A follow-up with a digoxin trough level.  I will give her a low dose of potassium to supplement a low potassium in the ER.  GI BLEED:    She has had no recurrent bleeding.  I will check a CBC in a couple of weeks as above.   TR:  We talked about this.  She is had no symptoms related to this.  I will follow this clinically.   Current medicines are reviewed at length with the patient today.  The patient does not have concerns regarding medicines.  The following changes have been made:  no change  Labs/ tests ordered today include:   Orders Placed This Encounter  Procedures  . Basic metabolic panel  . CBC  . Digoxin level     Disposition:   FU with me in one month     Signed, Minus Breeding, MD  05/18/2019 10:14 AM    [place]

## 2019-05-18 ENCOUNTER — Other Ambulatory Visit: Payer: Self-pay

## 2019-05-18 ENCOUNTER — Telehealth: Payer: Self-pay | Admitting: Cardiology

## 2019-05-18 ENCOUNTER — Ambulatory Visit (INDEPENDENT_AMBULATORY_CARE_PROVIDER_SITE_OTHER): Payer: Medicare Other | Admitting: Cardiology

## 2019-05-18 ENCOUNTER — Encounter: Payer: Self-pay | Admitting: Cardiology

## 2019-05-18 VITALS — BP 160/10 | HR 91 | Temp 96.3°F | Ht 68.0 in | Wt 190.4 lb

## 2019-05-18 DIAGNOSIS — I4819 Other persistent atrial fibrillation: Secondary | ICD-10-CM

## 2019-05-18 DIAGNOSIS — I361 Nonrheumatic tricuspid (valve) insufficiency: Secondary | ICD-10-CM | POA: Diagnosis not present

## 2019-05-18 MED ORDER — POTASSIUM CHLORIDE CRYS ER 20 MEQ PO TBCR: 40.0000 meq | EXTENDED_RELEASE_TABLET | Freq: Every day | ORAL | 0 refills | Status: DC

## 2019-05-18 MED ORDER — DIGOXIN 125 MCG PO TABS: 0.1250 mg | ORAL_TABLET | Freq: Every day | ORAL | 3 refills | Status: DC

## 2019-05-18 MED ORDER — APIXABAN 5 MG PO TABS: 5.0000 mg | ORAL_TABLET | Freq: Two times a day (BID) | ORAL | 3 refills | Status: DC

## 2019-05-18 NOTE — Addendum Note (Signed)
Addended by: Cain Sieve on: 05/18/2019 11:27 AM   Modules accepted: Orders

## 2019-05-18 NOTE — Telephone Encounter (Signed)
New message:    Patient calling concering some medications refill and also patient would like something for Stress. Please call patient.

## 2019-05-18 NOTE — Telephone Encounter (Signed)
Advised patient of the potassium added to medications.  She stated that [facility] could not get her digoxin, called and they have enough to do partial order and will order to complete RX. Advised patient, verbalized understanding.

## 2019-05-18 NOTE — Patient Instructions (Signed)
Medication Instructions:  Start taking 0.125mg  Digoxin Daily. Start taking 5mg  Eliquis twice Daily.   If you need a refill on your cardiac medications before your next appointment, please call your pharmacy.   Lab work: In 2 weeks come in morning for labs: CBC, BMET and Digoxin Level  If you have labs (blood work) drawn today and your tests are completely normal, you will receive your results only by: [place] (if you have MyChart) OR A paper copy in the mail If you have any lab test that is abnormal or we need to change your treatment, we will call you to review the results.  Testing/Procedures: NONE  Follow-Up: At [facility], you and your health needs are our priority.  As part of our continuing mission to provide you with exceptional heart care, we have created designated Provider Care Teams.  These Care Teams include your primary Cardiologist (physician) and Advanced Practice Providers (APPs -  Physician Assistants and Nurse Practitioners) who all work together to provide you with the care you need, when you need it. You may see Minus Breeding, MD or one of the following Advanced Practice Providers on your designated Care Team:    Rosaria Ferries, PA-C  Jory Sims, DNP, ANP  Cadence Kathlen Mody, NP  Your physician wants you to follow-up in: 1 month

## 2019-05-24 NOTE — Telephone Encounter (Signed)
[place], [place] - [place] [phone]   Consulted pharmD Tommy Medal, [place] who has approved request for permision to use a different brand of digoxin for the patient as long as the pharmacy updates the pt regarding the change in brands.  Attempted to contact pharmacist,Gaya, at the number provided. Automated message stated pharmacist at lunch and message may be left. Left message stating the above and advised to call back if needed

## 2019-05-24 NOTE — Telephone Encounter (Signed)
Gave Pharmacist Tommy Medal, [facility] approval for different brand. Verbalized understanding.

## 2019-05-24 NOTE — Telephone Encounter (Signed)
Follow up  Pharmacist is calling in to get permission to use a different brand to provide patient with medication Digoxin. Please give Kristopher Oppenheim Pharmacist Gaya a call back at [phone].

## 2019-06-01 DIAGNOSIS — I4819 Other persistent atrial fibrillation: Secondary | ICD-10-CM | POA: Diagnosis not present

## 2019-06-01 DIAGNOSIS — I361 Nonrheumatic tricuspid (valve) insufficiency: Secondary | ICD-10-CM | POA: Diagnosis not present

## 2019-06-02 LAB — DIGOXIN LEVEL: Digoxin, Serum: 0.6 ng/mL (ref 0.5–0.9)

## 2019-06-02 LAB — CBC
Hematocrit: 44.9 % (ref 34.0–46.6)
Hemoglobin: 15.5 g/dL (ref 11.1–15.9)
MCH: 31.8 pg (ref 26.6–33.0)
MCHC: 34.5 g/dL (ref 31.5–35.7)
MCV: 92 fL (ref 79–97)
Platelets: 227 10*3/uL (ref 150–450)
RBC: 4.87 x10E6/uL (ref 3.77–5.28)
RDW: 13.8 % (ref 11.7–15.4)
WBC: 5.4 10*3/uL (ref 3.4–10.8)

## 2019-06-02 LAB — BASIC METABOLIC PANEL
BUN/Creatinine Ratio: 16 (ref 12–28)
BUN: 15 mg/dL (ref 8–27)
CO2: 26 mmol/L (ref 20–29)
Calcium: 10 mg/dL (ref 8.7–10.3)
Chloride: 93 mmol/L — ABNORMAL LOW (ref 96–106)
Creatinine, Ser: 0.93 mg/dL (ref 0.57–1.00)
GFR calc Af Amer: 71 mL/min/{1.73_m2} (ref >59)
GFR calc non Af Amer: 62 mL/min/{1.73_m2} (ref >59)
Glucose: 96 mg/dL (ref 65–99)
Potassium: 4.4 mmol/L (ref 3.5–5.2)
Sodium: 135 mmol/L (ref 134–144)

## 2019-06-14 ENCOUNTER — Telehealth: Payer: Self-pay | Admitting: Cardiology

## 2019-06-14 ENCOUNTER — Other Ambulatory Visit: Payer: Self-pay

## 2019-06-14 NOTE — Telephone Encounter (Signed)
New message   Patient calling to report afib, nausea, weakness. BP 141/83 HR 113

## 2019-06-14 NOTE — Telephone Encounter (Signed)
Spoke to pt about taking Metoprolol. Pt states she was told at her last appt on 10/22 by Dr Percival Spanish to stop taking Metoprolol all together and to start taking Digoxin instead. She states she recalls a conversation with Dr Percival Spanish at her last visit that she was getting dizzy from Metoprolol and she actually fell. She stated Dr Percival Spanish told her to stop taking it although there is no recollection of Dr Percival Spanish stopping Metoprolol in his note from the office visit on 10/22. Pt states that she has not been taking Metoprolol at all. Will route to Dr Percival Spanish for review.

## 2019-06-14 NOTE — Telephone Encounter (Signed)
Returned call to patient she stated she wanted to let Dr.Hochrein know her heart rate has been fast averaging over 100 beats per min.Stated she has had only 2 days since she last saw Dr.Hochrein that her heart rate has been normal 72 and 74.She has been weak and not feeling well.Today she has felt better.She thought he might want to increase Digoxin dose, she is taking 125 mcg daily.Advised I will send message to Dr.Hochrein for advice.

## 2019-06-14 NOTE — Telephone Encounter (Signed)
Let us try to increase the metoprolol to 1/2 pill tid.

## 2019-06-14 NOTE — Progress Notes (Signed)
Per Dr Percival Spanish pt should be taking 12.5mg  Metoprolol Twice daily. Pt verbalized understanding.

## 2019-06-22 ENCOUNTER — Emergency Department (HOSPITAL_COMMUNITY)
Admission: EM | Admit: 2019-06-22 | Discharge: 2019-06-22 | Disposition: A | Discharge status: Home / Self Care | Payer: Medicare Other | Attending: Emergency Medicine | Admitting: Emergency Medicine

## 2019-06-22 ENCOUNTER — Encounter (HOSPITAL_COMMUNITY): Payer: Self-pay | Admitting: Emergency Medicine

## 2019-06-22 DIAGNOSIS — R040 Epistaxis: Secondary | ICD-10-CM | POA: Insufficient documentation

## 2019-06-22 DIAGNOSIS — I10 Essential (primary) hypertension: Secondary | ICD-10-CM | POA: Diagnosis not present

## 2019-06-22 DIAGNOSIS — Z7901 Long term (current) use of anticoagulants: Secondary | ICD-10-CM | POA: Diagnosis not present

## 2019-06-22 DIAGNOSIS — I4891 Unspecified atrial fibrillation: Secondary | ICD-10-CM | POA: Diagnosis not present

## 2019-06-22 DIAGNOSIS — Z79899 Other long term (current) drug therapy: Secondary | ICD-10-CM | POA: Insufficient documentation

## 2019-06-22 DIAGNOSIS — G4489 Other headache syndrome: Secondary | ICD-10-CM | POA: Diagnosis not present

## 2019-06-22 DIAGNOSIS — R000 Tachycardia, unspecified: Secondary | ICD-10-CM | POA: Diagnosis not present

## 2019-06-22 DIAGNOSIS — R0902 Hypoxemia: Secondary | ICD-10-CM | POA: Diagnosis not present

## 2019-06-22 MED ORDER — CEPHALEXIN 250 MG PO CAPS: 250.0000 mg | ORAL_CAPSULE | Freq: Four times a day (QID) | ORAL | 0 refills | Status: DC

## 2019-06-22 MED ORDER — OXYMETAZOLINE HCL 0.05 % NA SOLN
1.0000 | Freq: Once | NASAL | Status: AC
  Administered 2019-06-22: 1 via NASAL
  Filled 2019-06-22: qty 30

## 2019-06-22 NOTE — ED Provider Notes (Signed)
[facility] EMERGENCY DEPARTMENT Provider Note   CSN: [id number] Arrival date & time: 06/22/19  [id number]     History   Chief Complaint Chief Complaint  Patient presents with  . Epistaxis    HPI Hannah Collier is a 73 y.o. female.     HPI  73 year old female history of A. fib on Eliquis presents today complaining of right nares bleeding.  This began approximately 5 AM.  She attempted control with pressure but has continued to have bleeding and has had noted some clots.  She denies any lightheadedness, nausea, or vomiting.  She denies any current GI bleeding.  She denies history of Covid, cough, or respiratory infection symptoms.  Past Medical History:  Diagnosis Date  . Atrial fibrillation ([place])   . Gastric bypass status for obesity   . PCOS (polycystic ovarian syndrome)   . Tricuspid regurgitation     Patient Active Problem List   Diagnosis Date Noted  . Gastrointestinal hemorrhage with melena 10/25/2018  . 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 Hematochezia   . Status post colonoscopy with polypectomy   . Proximal colon ulcer   . History of gastric bypass 09/21/2018  . Generalized anxiety disorder 09/21/2018  . Transaminitis 09/21/2018  . Benign neoplasm of ascending colon   . Benign neoplasm of cecum   . Iron deficiency anemia   . Symptomatic anemia   . Acute GI bleeding 09/11/2018  . New onset a-fib ([place]) 09/11/2018  . Psoriatic arthritis ([place]) 09/11/2018  . Acute blood loss anemia 09/11/2018    Past Surgical History:  Procedure Laterality Date  . COLONOSCOPY WITH PROPOFOL N/A 09/13/2018   Procedure: COLONOSCOPY WITH PROPOFOL;  Surgeon: Ladene Artist, MD;  Location: [facility] ENDOSCOPY;  Service: Endoscopy;  Laterality: N/A;  . COLONOSCOPY WITH PROPOFOL N/A 09/24/2018   Procedure: COLONOSCOPY WITH PROPOFOL;  Surgeon: Mauri Pole, MD;  Location: [facility] ENDOSCOPY;  Service: Endoscopy;  Laterality: N/A;  .  ESOPHAGOGASTRODUODENOSCOPY (EGD) WITH PROPOFOL N/A 09/12/2018   Procedure: ESOPHAGOGASTRODUODENOSCOPY (EGD) WITH PROPOFOL;  Surgeon: Ladene Artist, MD;  Location: [facility] ENDOSCOPY;  Service: Endoscopy;  Laterality: N/A;  . POLYPECTOMY  09/13/2018   Procedure: POLYPECTOMY;  Surgeon: Ladene Artist, MD;  Location: [facility] ENDOSCOPY;  Service: Endoscopy;;  . REPLACEMENT TOTAL KNEE    . TONSILLECTOMY       OB History   No obstetric history on file.      Home Medications    Prior to Admission medications   Medication Sig Start Date End Date Taking? Authorizing Provider  apixaban (ELIQUIS) 5 MG TABS tablet Take 1 tablet (5 mg total) by mouth 2 (two) times daily. 05/18/19   Minus Breeding, MD  cetirizine (KLS ALLER-TEC) 10 MG tablet Take 10 mg by mouth daily.    [provider]  clonazePAM (KLONOPIN) 0.5 MG tablet Take 1 tablet (0.5 mg total) by mouth daily as needed for anxiety. Patient not taking: Reported on 05/18/2019 09/21/18 09/21/19  Velna Ochs, MD  diclofenac sodium (VOLTAREN) 1 % GEL Apply 2 g topically 4 (four) times daily. Patient not taking: Reported on 05/18/2019 09/13/18   [name], MD  digoxin (LANOXIN) 0.125 MG tablet Take 1 tablet (0.125 mg total) by mouth daily. 05/18/19   Minus Breeding, MD  ferrous sulfate 325 (65 FE) MG tablet Take 1 tablet (325 mg total) by mouth daily for 30 days. 09/29/18 10/29/18  Lars Mage, MD  FLUoxetine (PROZAC) 10 MG capsule Take 1 capsule (10 mg total) by mouth  daily. Patient not taking: Reported on 05/18/2019 09/21/18 09/21/19  Velna Ochs, MD  lidocaine (LIDODERM) 5 % Place 1 patch onto the skin as needed. Remove & Discard patch within 12 hours or as directed by MD Patient not taking: Reported on 05/18/2019 09/13/18   [name], MD  LORazepam (ATIVAN) 0.5 MG tablet Take 1 tablet (0.5 mg total) by mouth 3 (three) times daily as needed for anxiety. 05/15/19   Carlisle Cater, PA-C  metoprolol tartrate (LOPRESSOR) 25  MG tablet Take 0.5 tablets (12.5 mg total) by mouth 2 (two) times daily. 05/15/19   Carlisle Cater, PA-C  potassium chloride SA (KLOR-CON M20) 20 MEQ tablet Take 2 tablets (40 mEq total) by mouth daily. 05/18/19 08/16/19  Minus Breeding, MD  triamcinolone cream (KENALOG) 0.1 % Apply 1 application topically 2 (two) times daily. 06/19/17   Wynona Luna, MD    Family History Family History  Problem Relation Age of Onset  . COPD Mother        Did not know her history  . Throat cancer Father     Social History Social History   Tobacco Use  . Smoking status: Never Smoker  . Smokeless tobacco: Never Used  Substance Use Topics  . Alcohol use: Yes  . Drug use: Not on file     Allergies   Sulfa antibiotics   Review of Systems Review of Systems  All other systems reviewed and are negative.    Physical Exam Updated Vital Signs BP (!) 147/79   Pulse 97   Temp 98.1 F (36.7 C) (Oral)   Resp 17   Ht 1.727 m (5\' 8" )   Wt 83.5 kg   SpO2 98%   BMI 27.98 kg/m   Physical Exam Vitals signs reviewed.  Constitutional:      General: She is not in acute distress.    Appearance: Normal appearance. She is not ill-appearing.  HENT:     Head: Normocephalic.     Right Ear: External ear normal.     Left Ear: External ear normal.     Nose:     Comments: Right nares with clot and some clot in oropharynx Eyes:     Pupils: Pupils are equal, round, and reactive to light.  Cardiovascular:     Rate and Rhythm: Normal rate. Rhythm irregular.  Pulmonary:     Effort: Pulmonary effort is normal.  Abdominal:     General: Abdomen is flat.  Skin:    General: Skin is warm and dry.     Capillary Refill: Capillary refill takes less than 2 seconds.  Neurological:     General: No focal deficit present.     Mental Status: She is alert and oriented to person, place, and time.      ED Treatments / Results  Labs (all labs ordered are listed, but only abnormal results are displayed)  Labs Reviewed - No data to display  EKG None  Radiology No results found.  Procedures .Epistaxis Management  Date/Time: 06/22/2019 10:22 AM Performed by: Pattricia Boss, MD Authorized by: Pattricia Boss, MD   Consent:    Consent obtained:  Verbal   Consent given by:  Patient Anesthesia (see MAR for exact dosages):    Anesthesia method:  Topical application   Topical anesthetic:  Lidocaine gel Procedure details:    Treatment site:  R anterior   Treatment method:  Nasal balloon   Treatment episode: initial   Post-procedure details:    Assessment:  Bleeding stopped  Patient tolerance of procedure:  Tolerated well, no immediate complications   (including critical care time)  Medications Ordered in ED Medications  oxymetazoline (AFRIN) 0.05 % nasal spray 1 spray (1 spray Each Nare Given by Other 06/22/19 0908)     Initial Impression / Assessment and Plan / ED Course  I have reviewed the triage vital signs and the nursing notes.  Pertinent labs & imaging results that were available during my care of the patient were reviewed by me and considered in my medical decision making (see chart for details).       73 year old female came in with nasal bleeding.  She had nasal balloon placed with good control of epistaxis. Discussed with Dr. Wilburn Cornelia.  Will place on Keflex.  She can call office on Friday and be seen early next week for packing removal Discussed return precautions need for follow-up and she voices understanding. Final Clinical Impressions(s) / ED Diagnoses   Final diagnoses:  Epistaxis    ED Discharge Orders    None       Pattricia Boss, MD 06/22/19 1044

## 2019-06-22 NOTE — ED Triage Notes (Signed)
Pt has had nose bleed since 0500 this morning- pt takes blood thinner. Pt is currently holding direct pressure and bleeding control however did arrives with emesis of blood she had been spitting up after draining into throat.

## 2019-06-22 NOTE — Discharge Instructions (Addendum)
Call Dr. Victorio Palm office on Friday for appointment to be seen early next week

## 2019-06-22 NOTE — ED Provider Notes (Signed)
[facility] EMERGENCY DEPARTMENT Provider Note   CSN: [id number] Arrival date & time: 06/22/19  [id number]     History   Chief Complaint Chief Complaint  Patient presents with  . Epistaxis    HPI Hannah Collier is a 73 y.o. female.     HPI  Past Medical History:  Diagnosis Date  . Atrial fibrillation ([place])   . Gastric bypass status for obesity   . PCOS (polycystic ovarian syndrome)   . Tricuspid regurgitation     Patient Active Problem List   Diagnosis Date Noted  . Gastrointestinal hemorrhage with melena 10/25/2018  . 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 Hematochezia   . Status post colonoscopy with polypectomy   . Proximal colon ulcer   . History of gastric bypass 09/21/2018  . Generalized anxiety disorder 09/21/2018  . Transaminitis 09/21/2018  . Benign neoplasm of ascending colon   . Benign neoplasm of cecum   . Iron deficiency anemia   . Symptomatic anemia   . Acute GI bleeding 09/11/2018  . New onset a-fib ([place]) 09/11/2018  . Psoriatic arthritis ([place]) 09/11/2018  . Acute blood loss anemia 09/11/2018    Past Surgical History:  Procedure Laterality Date  . COLONOSCOPY WITH PROPOFOL N/A 09/13/2018   Procedure: COLONOSCOPY WITH PROPOFOL;  Surgeon: Ladene Artist, MD;  Location: [facility] ENDOSCOPY;  Service: Endoscopy;  Laterality: N/A;  . COLONOSCOPY WITH PROPOFOL N/A 09/24/2018   Procedure: COLONOSCOPY WITH PROPOFOL;  Surgeon: Mauri Pole, MD;  Location: [place] ENDOSCOPY;  Service: Endoscopy;  Laterality: N/A;  . ESOPHAGOGASTRODUODENOSCOPY (EGD) WITH PROPOFOL N/A 09/12/2018   Procedure: ESOPHAGOGASTRODUODENOSCOPY (EGD) WITH PROPOFOL;  Surgeon: Ladene Artist, MD;  Location: [facility] ENDOSCOPY;  Service: Endoscopy;  Laterality: N/A;  . POLYPECTOMY  09/13/2018   Procedure: POLYPECTOMY;  Surgeon: Ladene Artist, MD;  Location: [facility] ENDOSCOPY;  Service: Endoscopy;;  . REPLACEMENT TOTAL KNEE    . TONSILLECTOMY       OB History    No obstetric history on file.      Home Medications    Prior to Admission medications   Medication Sig Start Date End Date Taking? Authorizing Provider  apixaban (ELIQUIS) 5 MG TABS tablet Take 1 tablet (5 mg total) by mouth 2 (two) times daily. 05/18/19   Minus Breeding, MD  cetirizine (KLS ALLER-TEC) 10 MG tablet Take 10 mg by mouth daily.    [provider]  clonazePAM (KLONOPIN) 0.5 MG tablet Take 1 tablet (0.5 mg total) by mouth daily as needed for anxiety. Patient not taking: Reported on 05/18/2019 09/21/18 09/21/19  Velna Ochs, MD  diclofenac sodium (VOLTAREN) 1 % GEL Apply 2 g topically 4 (four) times daily. Patient not taking: Reported on 05/18/2019 09/13/18   [name], MD  digoxin (LANOXIN) 0.125 MG tablet Take 1 tablet (0.125 mg total) by mouth daily. 05/18/19   Minus Breeding, MD  ferrous sulfate 325 (65 FE) MG tablet Take 1 tablet (325 mg total) by mouth daily for 30 days. 09/29/18 10/29/18  Lars Mage, MD  FLUoxetine (PROZAC) 10 MG capsule Take 1 capsule (10 mg total) by mouth daily. Patient not taking: Reported on 05/18/2019 09/21/18 09/21/19  Velna Ochs, MD  lidocaine (LIDODERM) 5 % Place 1 patch onto the skin as needed. Remove & Discard patch within 12 hours or as directed by MD Patient not taking: Reported on 05/18/2019 09/13/18   [name], MD  LORazepam (ATIVAN) 0.5 MG tablet Take 1 tablet (0.5 mg total) by mouth 3 (three)  times daily as needed for anxiety. 05/15/19   Carlisle Cater, PA-C  metoprolol tartrate (LOPRESSOR) 25 MG tablet Take 0.5 tablets (12.5 mg total) by mouth 2 (two) times daily. 05/15/19   Carlisle Cater, PA-C  potassium chloride SA (KLOR-CON M20) 20 MEQ tablet Take 2 tablets (40 mEq total) by mouth daily. 05/18/19 08/16/19  Minus Breeding, MD  triamcinolone cream (KENALOG) 0.1 % Apply 1 application topically 2 (two) times daily. 06/19/17   Wynona Luna, MD    Family History Family History  Problem Relation  Age of Onset  . COPD Mother        Did not know her history  . Throat cancer Father     Social History Social History   Tobacco Use  . Smoking status: Never Smoker  . Smokeless tobacco: Never Used  Substance Use Topics  . Alcohol use: Yes  . Drug use: Not on file     Allergies   Sulfa antibiotics   Review of Systems Review of Systems   Physical Exam Updated Vital Signs BP (!) 137/94   Pulse 100   Temp 98.1 F (36.7 C) (Oral)   Resp 17   Ht 5\' 8"  (1.727 m)   Wt 83.5 kg   SpO2 95%   BMI 27.98 kg/m   Physical Exam   ED Treatments / Results  Labs (all labs ordered are listed, but only abnormal results are displayed) Labs Reviewed - No data to display  EKG None  Radiology No results found.  Procedures .Epistaxis Management  Date/Time: 06/22/2019 9:56 AM Performed by: Fransico Meadow, PA-C Authorized by: Fransico Meadow, PA-C   Consent:    Consent obtained:  Verbal   Consent given by:  Patient   Risks discussed:  Bleeding   Alternatives discussed:  No treatment Anesthesia (see MAR for exact dosages):    Anesthesia method:  Topical application   Topical anesthetic:  Lidocaine gel Procedure details:    Treatment site:  L anterior   Treatment method:  Anterior pack   Treatment complexity:  Limited Post-procedure details:    Assessment:  Bleeding decreased   Patient tolerance of procedure:  Tolerated well, no immediate complications Comments:     Small nares, 5.5 Rhinorocket, no balloon.     (including critical care time)  Medications Ordered in ED Medications  oxymetazoline (AFRIN) 0.05 % nasal spray 1 spray (1 spray Each Nare Given by Other 06/22/19 0908)     Initial Impression / Assessment and Plan / ED Course  I have reviewed the triage vital signs and the nursing notes.  Pertinent labs & imaging results that were available during my care of the patient were reviewed by me and considered in my medical decision making (see chart for  details).          Final Clinical Impressions(s) / ED Diagnoses   Final diagnoses:  None    ED Discharge Orders    None       Fransico Meadow, [place] 06/22/19 [id number]    Pattricia Boss, MD 06/23/19 1807

## 2019-06-22 NOTE — ED Notes (Signed)
ENT cart at bedside

## 2019-06-22 NOTE — ED Notes (Signed)
Pt d/c home per MD order , discharge summary reviewed with pt, pt verbalizes understanding. Visitor with pt. Off unit via WC . Reports ride is on the way.

## 2019-06-24 NOTE — Progress Notes (Signed)
Cardiology Office Note   Date:  06/26/2019   ID:  Hannah Collier, DOB April 05, 1946, MRN [id number]  PCP:  Gaynelle Arabian, MD  Cardiologist:   Minus Breeding, MD   No chief complaint on file.     History of Present Illness: Hannah Collier is a 73 y.o. female who presents for follow up of atrial fib.  She was in the ED for this recently.  At the last visit added digoxin.  She was supposed to remain on metoprolol but did not understand this.  I corrected that through a phone message after she called with continued increased heart rate.   Since I saw her she was in the ED with epistaxis and I reviewed these records for this appt.  She was treated with topical lidocaine and Afrin.  Her nose is currently packed.  The patient is keeping her blood pressure and heart rate diary.  Although the blood pressure is elevated today it has been okay at home.  Her heart rate is much improved compared to previous.  She says her blood pressure does go up towards the evening before she takes her second dose of metoprolol.  She is rarely feeling some palpitations.  She is going to see ENT today to get her nose unpacked and find out if she is at continued risk of bleeding.  She is not had any GI bleeding.  She denies any presyncope or syncope.  There is no new shortness of breath.   Past Medical History:  Diagnosis Date  . Atrial fibrillation ([place])   . Gastric bypass status for obesity   . PCOS (polycystic ovarian syndrome)   . Tricuspid regurgitation     Past Surgical History:  Procedure Laterality Date  . COLONOSCOPY WITH PROPOFOL N/A 09/13/2018   Procedure: COLONOSCOPY WITH PROPOFOL;  Surgeon: Ladene Artist, MD;  Location: [facility] ENDOSCOPY;  Service: Endoscopy;  Laterality: N/A;  . COLONOSCOPY WITH PROPOFOL N/A 09/24/2018   Procedure: COLONOSCOPY WITH PROPOFOL;  Surgeon: Mauri Pole, MD;  Location: [place] ENDOSCOPY;  Service: Endoscopy;  Laterality: N/A;  . ESOPHAGOGASTRODUODENOSCOPY (EGD) WITH  PROPOFOL N/A 09/12/2018   Procedure: ESOPHAGOGASTRODUODENOSCOPY (EGD) WITH PROPOFOL;  Surgeon: Ladene Artist, MD;  Location: [facility] ENDOSCOPY;  Service: Endoscopy;  Laterality: N/A;  . POLYPECTOMY  09/13/2018   Procedure: POLYPECTOMY;  Surgeon: Ladene Artist, MD;  Location: [facility] ENDOSCOPY;  Service: Endoscopy;;  . REPLACEMENT TOTAL KNEE    . TONSILLECTOMY       Current Outpatient Medications  Medication Sig Dispense Refill  . apixaban (ELIQUIS) 5 MG TABS tablet Take 1 tablet (5 mg total) by mouth 2 (two) times daily. 180 tablet 3  . cephALEXin (KEFLEX) 250 MG capsule Take 1 capsule (250 mg total) by mouth 4 (four) times daily. 28 capsule 0  . cetirizine (KLS ALLER-TEC) 10 MG tablet Take 10 mg by mouth daily.    . digoxin (LANOXIN) 0.125 MG tablet Take 1 tablet (0.125 mg total) by mouth daily. 90 tablet 3  . ferrous sulfate 325 (65 FE) MG tablet Take 1 tablet (325 mg total) by mouth daily for 30 days. 30 tablet 0  . LORazepam (ATIVAN) 0.5 MG tablet Take 1 tablet (0.5 mg total) by mouth 3 (three) times daily as needed for anxiety. 10 tablet 0  . metoprolol tartrate (LOPRESSOR) 25 MG tablet Take 0.5 tablets (12.5 mg total) by mouth daily as needed. 30 tablet 0  . triamcinolone cream (KENALOG) 0.1 % Apply 1 application topically 2 (two) times  daily. 453.6 g 0  . metoprolol succinate (TOPROL-XL) 25 MG 24 hr tablet Take 1 tablet (25 mg total) by mouth daily. Take with or immediately following a meal. 90 tablet 3   No current facility-administered medications for this visit.     Allergies:   Sulfa antibiotics     ROS:  Please see the history of present illness.   Otherwise, review of systems are positive for none.   All other systems are reviewed and negative.    PHYSICAL EXAM: VS:  BP (!) 167/101   Pulse 77   Ht 5\' 8"  (1.727 m)   Wt 187 lb (84.8 kg)   SpO2 90%   BMI 28.43 kg/m  , BMI Body mass index is 28.43 kg/m. GENERAL:  Well appearing NECK:  No jugular venous distention, waveform  within normal limits, carotid upstroke brisk and symmetric, no bruits, no thyromegaly LUNGS:  Clear to auscultation bilaterally CHEST:  Unremarkable HEART:  PMI not displaced or sustained,S1 and S2 within normal limits, no S3, no clicks, no rubs, no murmurs, irregular ABD:  Flat, positive bowel sounds normal in frequency in pitch, no bruits, no rebound, no guarding, no midline pulsatile mass, no hepatomegaly, no splenomegaly EXT:  2 plus pulses throughout, no edema, no cyanosis no clubbing SKIN:  Rash improved  EKG:  EKG is not ordered today.   Recent Labs: 09/13/2018: B Natriuretic Peptide 580.0 09/23/2018: ALT 21 05/15/2019: Magnesium 2.1 06/01/2019: BUN 15; Creatinine, Ser 0.93; Hemoglobin 15.5; Platelets 227; Potassium 4.4; Sodium 135    Lipid Panel No results found for: CHOL, TRIG, HDL, CHOLHDL, VLDL, LDLCALC, LDLDIRECT    Wt Readings from Last 3 Encounters:  06/26/19 187 lb (84.8 kg)  06/22/19 184 lb (83.5 kg)  05/18/19 190 lb 6.4 oz (86.4 kg)      Other studies Reviewed: Additional studies/ records that were reviewed today include: None Review of the above records demonstrates:  Please see elsewhere in the note.     ASSESSMENT AND PLAN:  PERSISTENT ATRIAL FIB:     We had a long discussion about this again.  At this point given the good rate control I do not see an advantage to rhythm management.  Rather she is getting continue with anticoagulation and follow-up with ENT.  She had I will discuss if there is continued high risk of bleeding.  I will be checking a CBC today.  I am going to switch her to Toprol-XL 25 mg to see if this allows for smoother blood pressure and heart rate control.  She might need as needed immediate release as well.   GI BLEED:    I will check a CBC today.  TR:  She has no symptoms related to this.  I will follow this clinically.  HEALTH MAINTENANCE:  I will giver her the Flu Shot  Current medicines are reviewed at length with the patient  today.  The patient does not have concerns regarding medicines.  The following changes have been made: As above  Labs/ tests ordered today include:     Orders Placed This Encounter  Procedures  . CBC     Disposition:   FU with me in 3 months.    Signed, Minus Breeding, MD  06/26/2019 12:57 PM    [facility]

## 2019-06-26 ENCOUNTER — Other Ambulatory Visit: Payer: Self-pay

## 2019-06-26 ENCOUNTER — Ambulatory Visit (INDEPENDENT_AMBULATORY_CARE_PROVIDER_SITE_OTHER): Payer: Medicare Other | Admitting: Cardiology

## 2019-06-26 ENCOUNTER — Encounter: Payer: Self-pay | Admitting: Cardiology

## 2019-06-26 VITALS — BP 167/101 | HR 77 | Ht 68.0 in | Wt 187.0 lb

## 2019-06-26 DIAGNOSIS — I361 Nonrheumatic tricuspid (valve) insufficiency: Secondary | ICD-10-CM

## 2019-06-26 DIAGNOSIS — I4819 Other persistent atrial fibrillation: Secondary | ICD-10-CM | POA: Diagnosis not present

## 2019-06-26 MED ORDER — METOPROLOL TARTRATE 25 MG PO TABS: 12.5000 mg | ORAL_TABLET | Freq: Every day | ORAL | 0 refills | Status: DC | PRN

## 2019-06-26 MED ORDER — METOPROLOL SUCCINATE ER 25 MG PO TB24: 25.0000 mg | ORAL_TABLET | Freq: Every day | ORAL | 3 refills | Status: DC

## 2019-06-26 NOTE — Patient Instructions (Addendum)
Medication Instructions:  Start taking 25mg  Metoprolol once daily.  Start taking Lopressor as needed. If you need a refill on your cardiac medications before your next appointment, please call your pharmacy.   Lab work: CBC If you have labs (blood work) drawn today and your tests are completely normal, you will receive your results only by: [place] (if you have MyChart) OR A paper copy in the mail If you have any lab test that is abnormal or we need to change your treatment, we will call you to review the results.  Testing/Procedures: NONE  Follow-Up: At [facility], you and your health needs are our priority.  As part of our continuing mission to provide you with exceptional heart care, we have created designated Provider Care Teams.  These Care Teams include your primary Cardiologist (physician) and Advanced Practice Providers (APPs -  Physician Assistants and Nurse Practitioners) who all work together to provide you with the care you need, when you need it. You may see Minus Breeding, MD or one of the following Advanced Practice Providers on your designated Care Team:    Rosaria Ferries, PA-C  Jory Sims, DNP, ANP  Cadence Kathlen Mody, NP  Your physician wants you to follow-up in: 3 months.

## 2019-06-27 DIAGNOSIS — R040 Epistaxis: Secondary | ICD-10-CM | POA: Diagnosis not present

## 2019-06-27 DIAGNOSIS — Z7901 Long term (current) use of anticoagulants: Secondary | ICD-10-CM | POA: Diagnosis not present

## 2019-06-27 DIAGNOSIS — I4891 Unspecified atrial fibrillation: Secondary | ICD-10-CM | POA: Diagnosis not present

## 2019-06-27 LAB — CBC
Hematocrit: 35.7 % (ref 34.0–46.6)
Hemoglobin: 12 g/dL (ref 11.1–15.9)
MCH: 30.7 pg (ref 26.6–33.0)
MCHC: 33.6 g/dL (ref 31.5–35.7)
MCV: 91 fL (ref 79–97)
Platelets: 296 10*3/uL (ref 150–450)
RBC: 3.91 x10E6/uL (ref 3.77–5.28)
RDW: 14.4 % (ref 11.7–15.4)
WBC: 5.9 10*3/uL (ref 3.4–10.8)

## 2019-08-31 NOTE — Progress Notes (Signed)
Cardiology Office Note   Date:  09/01/2019   ID:  Hannah Collier, DOB 1946-04-11, MRN [id number]  PCP:  Gaynelle Arabian, MD  Cardiologist:   Minus Breeding, MD   Chief Complaint  Patient presents with  . Atrial Fibrillation      History of Present Illness: Hannah Collier is a 74 y.o. female who presents for follow up of atrial fib.  Since I last saw her she rarely feels palpitations.  She will occasionally take an extra half of her metoprolol but it has only had to do this about 4 times.  She is otherwise not feeling her atrial fibrillation which I think to be permanent.  She is not having any presyncope or syncope.  She is not having any shortness of breath, PND or orthopnea.  She seems to be tolerating her anticoagulation.  She has had no further bleeding.    Past Medical History:  Diagnosis Date  . Atrial fibrillation ([place])   . Gastric bypass status for obesity   . PCOS (polycystic ovarian syndrome)   . Tricuspid regurgitation     Past Surgical History:  Procedure Laterality Date  . COLONOSCOPY WITH PROPOFOL N/A 09/13/2018   Procedure: COLONOSCOPY WITH PROPOFOL;  Surgeon: Ladene Artist, MD;  Location: [facility] ENDOSCOPY;  Service: Endoscopy;  Laterality: N/A;  . COLONOSCOPY WITH PROPOFOL N/A 09/24/2018   Procedure: COLONOSCOPY WITH PROPOFOL;  Surgeon: Mauri Pole, MD;  Location: [place] ENDOSCOPY;  Service: Endoscopy;  Laterality: N/A;  . ESOPHAGOGASTRODUODENOSCOPY (EGD) WITH PROPOFOL N/A 09/12/2018   Procedure: ESOPHAGOGASTRODUODENOSCOPY (EGD) WITH PROPOFOL;  Surgeon: Ladene Artist, MD;  Location: [facility] ENDOSCOPY;  Service: Endoscopy;  Laterality: N/A;  . POLYPECTOMY  09/13/2018   Procedure: POLYPECTOMY;  Surgeon: Ladene Artist, MD;  Location: [facility] ENDOSCOPY;  Service: Endoscopy;;  . REPLACEMENT TOTAL KNEE    . TONSILLECTOMY       Current Outpatient Medications  Medication Sig Dispense Refill  . apixaban (ELIQUIS) 5 MG TABS tablet Take 1 tablet (5 mg total) by  mouth 2 (two) times daily. 180 tablet 3  . cetirizine (KLS ALLER-TEC) 10 MG tablet Take 10 mg by mouth daily.    . digoxin (LANOXIN) 0.125 MG tablet Take 1 tablet (0.125 mg total) by mouth daily. 90 tablet 3  . metoprolol succinate (TOPROL-XL) 25 MG 24 hr tablet Take 1 tablet (25 mg total) by mouth daily. Take with or immediately following a meal. 90 tablet 3  . metoprolol tartrate (LOPRESSOR) 25 MG tablet Take 0.5 tablets (12.5 mg total) by mouth daily as needed. 30 tablet 0  . triamcinolone cream (KENALOG) 0.1 % Apply 1 application topically 2 (two) times daily. 453.6 g 0   No current facility-administered medications for this visit.    Allergies:   Sulfa antibiotics     ROS:  Please see the history of present illness.   Otherwise, review of systems are positive for none.   All other systems are reviewed and negative.    PHYSICAL EXAM: VS:  BP (!) 148/82   Pulse 67   Ht 5\' 5"  (1.651 m)   Wt 188 lb 9.6 oz (85.5 kg)   SpO2 96%   BMI 31.38 kg/m  , BMI Body mass index is 31.38 kg/m. GENERAL:  Well appearing NECK:  No jugular venous distention, CV wave, carotid upstroke brisk and symmetric, no bruits, no thyromegaly LUNGS:  Clear to auscultation bilaterally CHEST:  Unremarkable HEART:  PMI not displaced or sustained,S1 and S2 within normal limits, no  S3,  no clicks, no rubs, 3 out of 6 holosystolic murmur, no diastolic murmurs, irregular ABD:  Flat, positive bowel sounds normal in frequency in pitch, no bruits, no rebound, no guarding, no midline pulsatile mass, no hepatomegaly, no splenomegaly EXT:  2 plus pulses throughout, no edema, no cyanosis no clubbing   EKG:  EKG is ordered today. Atrial fibrillation, rate 67, leftward axis, poor anterior R wave progression, nonspecific ST-T wave.  Recent Labs: 09/13/2018: B Natriuretic Peptide 580.0 09/23/2018: ALT 21 05/15/2019: Magnesium 2.1 06/01/2019: BUN 15; Creatinine, Ser 0.93; Potassium 4.4; Sodium 135 06/26/2019: Hemoglobin  12.0; Platelets 296    Lipid Panel No results found for: CHOL, TRIG, HDL, CHOLHDL, VLDL, LDLCALC, LDLDIRECT    Wt Readings from Last 3 Encounters:  09/01/19 188 lb 9.6 oz (85.5 kg)  06/26/19 187 lb (84.8 kg)  06/22/19 184 lb (83.5 kg)      Other studies Reviewed: Additional studies/ records that were reviewed today include: Labs Review of the above records demonstrates:  Please see elsewhere in the note.     ASSESSMENT AND PLAN:  PERSISTENT ATRIAL FIB:     She tolerates anticoagulation.  No change in therapy.  Rate seems to be controlled.   GI BLEED:    Hemoglobin was stable in November.  She will get this checked again by Gaynelle Arabian, MD EN April.   TR: This is noted on exam and on her previous echo.  I will follow this clinically.  There is no indication for repair in this situation.  COVID EDUCATION: She has her name to get the vaccine when they call her.  Current medicines are reviewed at length with the patient today.  The patient does not have concerns regarding medicines.  The following changes have been made: As above  Labs/ tests ordered today include:     No orders of the defined types were placed in this encounter.    Disposition:   FU with me in 12 months.    Signed, Minus Breeding, MD  09/01/2019 3:11 PM    [facility]

## 2019-09-01 ENCOUNTER — Other Ambulatory Visit: Payer: Self-pay

## 2019-09-01 ENCOUNTER — Encounter: Payer: Self-pay | Admitting: Cardiology

## 2019-09-01 ENCOUNTER — Ambulatory Visit (INDEPENDENT_AMBULATORY_CARE_PROVIDER_SITE_OTHER): Payer: Medicare Other | Admitting: Cardiology

## 2019-09-01 VITALS — BP 148/82 | HR 67 | Ht 65.0 in | Wt 188.6 lb

## 2019-09-01 DIAGNOSIS — Z7189 Other specified counseling: Secondary | ICD-10-CM

## 2019-09-01 DIAGNOSIS — I4819 Other persistent atrial fibrillation: Secondary | ICD-10-CM | POA: Diagnosis not present

## 2019-09-01 DIAGNOSIS — I361 Nonrheumatic tricuspid (valve) insufficiency: Secondary | ICD-10-CM

## 2019-09-01 NOTE — Patient Instructions (Signed)
Medication Instructions:  No changes *If you need a refill on your cardiac medications before your next appointment, please call your pharmacy*  Lab Work: None  Testing/Procedures: None  Follow-Up: At [facility], you and your health needs are our priority.  As part of our continuing mission to provide you with exceptional heart care, we have created designated Provider Care Teams.  These Care Teams include your primary Cardiologist (physician) and Advanced Practice Providers (APPs -  Physician Assistants and Nurse Practitioners) who all work together to provide you with the care you need, when you need it.  Your next appointment:   1 year(s)  You will receive a reminder letter in the mail two months in advance. If you don't receive a letter, please call our office to schedule the follow-up appointment.   The format for your next appointment:   In Person  Provider:   James Hochrein, MD  

## 2019-11-20 DIAGNOSIS — Z0000 Encounter for general adult medical examination without abnormal findings: Secondary | ICD-10-CM | POA: Diagnosis not present

## 2019-11-20 DIAGNOSIS — D692 Other nonthrombocytopenic purpura: Secondary | ICD-10-CM | POA: Diagnosis not present

## 2019-11-20 DIAGNOSIS — Z131 Encounter for screening for diabetes mellitus: Secondary | ICD-10-CM | POA: Diagnosis not present

## 2019-11-20 DIAGNOSIS — I4819 Other persistent atrial fibrillation: Secondary | ICD-10-CM | POA: Diagnosis not present

## 2019-11-20 DIAGNOSIS — H6121 Impacted cerumen, right ear: Secondary | ICD-10-CM | POA: Diagnosis not present

## 2019-11-20 DIAGNOSIS — Z13220 Encounter for screening for lipoid disorders: Secondary | ICD-10-CM | POA: Diagnosis not present

## 2019-11-20 DIAGNOSIS — I071 Rheumatic tricuspid insufficiency: Secondary | ICD-10-CM | POA: Diagnosis not present

## 2019-11-20 DIAGNOSIS — M2550 Pain in unspecified joint: Secondary | ICD-10-CM | POA: Diagnosis not present

## 2019-11-20 DIAGNOSIS — Z23 Encounter for immunization: Secondary | ICD-10-CM | POA: Diagnosis not present

## 2019-11-20 DIAGNOSIS — Z1231 Encounter for screening mammogram for malignant neoplasm of breast: Secondary | ICD-10-CM | POA: Diagnosis not present

## 2019-11-20 DIAGNOSIS — Z136 Encounter for screening for cardiovascular disorders: Secondary | ICD-10-CM | POA: Diagnosis not present

## 2019-11-20 DIAGNOSIS — D6869 Other thrombophilia: Secondary | ICD-10-CM | POA: Diagnosis not present

## 2019-11-27 ENCOUNTER — Other Ambulatory Visit: Payer: Self-pay | Admitting: Family Medicine

## 2019-11-27 DIAGNOSIS — Z1231 Encounter for screening mammogram for malignant neoplasm of breast: Secondary | ICD-10-CM

## 2019-12-08 ENCOUNTER — Other Ambulatory Visit: Payer: Self-pay

## 2019-12-08 ENCOUNTER — Ambulatory Visit
Admission: RE | Admit: 2019-12-08 | Discharge: 2019-12-08 | Disposition: A | Discharge status: Home / Self Care | Payer: Medicare Other | Source: Ambulatory Visit | Attending: Family Medicine | Admitting: Family Medicine

## 2019-12-08 DIAGNOSIS — Z1231 Encounter for screening mammogram for malignant neoplasm of breast: Secondary | ICD-10-CM

## 2020-01-01 DIAGNOSIS — Z974 Presence of external hearing-aid: Secondary | ICD-10-CM | POA: Diagnosis not present

## 2020-01-01 DIAGNOSIS — H9041 Sensorineural hearing loss, unilateral, right ear, with unrestricted hearing on the contralateral side: Secondary | ICD-10-CM | POA: Diagnosis not present

## 2020-01-01 DIAGNOSIS — H938X3 Other specified disorders of ear, bilateral: Secondary | ICD-10-CM | POA: Diagnosis not present

## 2020-01-01 DIAGNOSIS — H6121 Impacted cerumen, right ear: Secondary | ICD-10-CM | POA: Diagnosis not present

## 2020-01-01 DIAGNOSIS — L299 Pruritus, unspecified: Secondary | ICD-10-CM | POA: Diagnosis not present

## 2020-01-31 DIAGNOSIS — R531 Weakness: Secondary | ICD-10-CM | POA: Diagnosis not present

## 2020-01-31 DIAGNOSIS — M2550 Pain in unspecified joint: Secondary | ICD-10-CM | POA: Diagnosis not present

## 2020-01-31 DIAGNOSIS — L409 Psoriasis, unspecified: Secondary | ICD-10-CM | POA: Diagnosis not present

## 2020-02-21 DIAGNOSIS — L409 Psoriasis, unspecified: Secondary | ICD-10-CM | POA: Diagnosis not present

## 2020-02-21 DIAGNOSIS — M1120 Other chondrocalcinosis, unspecified site: Secondary | ICD-10-CM | POA: Diagnosis not present

## 2020-02-21 DIAGNOSIS — M2550 Pain in unspecified joint: Secondary | ICD-10-CM | POA: Diagnosis not present

## 2020-02-21 DIAGNOSIS — E663 Overweight: Secondary | ICD-10-CM | POA: Diagnosis not present

## 2020-02-21 DIAGNOSIS — M150 Primary generalized (osteo)arthritis: Secondary | ICD-10-CM | POA: Diagnosis not present

## 2020-02-21 DIAGNOSIS — M545 Low back pain: Secondary | ICD-10-CM | POA: Diagnosis not present

## 2020-02-21 DIAGNOSIS — Z6827 Body mass index (BMI) 27.0-27.9, adult: Secondary | ICD-10-CM | POA: Diagnosis not present

## 2020-02-21 DIAGNOSIS — R7989 Other specified abnormal findings of blood chemistry: Secondary | ICD-10-CM | POA: Diagnosis not present

## 2020-02-21 DIAGNOSIS — M154 Erosive (osteo)arthritis: Secondary | ICD-10-CM | POA: Diagnosis not present

## 2020-03-20 DIAGNOSIS — M5441 Lumbago with sciatica, right side: Secondary | ICD-10-CM | POA: Diagnosis not present

## 2020-03-20 DIAGNOSIS — R030 Elevated blood-pressure reading, without diagnosis of hypertension: Secondary | ICD-10-CM | POA: Diagnosis not present

## 2020-03-20 DIAGNOSIS — R29898 Other symptoms and signs involving the musculoskeletal system: Secondary | ICD-10-CM | POA: Diagnosis not present

## 2020-03-20 DIAGNOSIS — M5442 Lumbago with sciatica, left side: Secondary | ICD-10-CM | POA: Diagnosis not present

## 2020-03-20 DIAGNOSIS — G8929 Other chronic pain: Secondary | ICD-10-CM | POA: Diagnosis not present

## 2020-03-28 ENCOUNTER — Other Ambulatory Visit: Payer: Self-pay | Admitting: Pain Medicine

## 2020-03-28 DIAGNOSIS — G8929 Other chronic pain: Secondary | ICD-10-CM

## 2020-04-21 ENCOUNTER — Ambulatory Visit
Admission: RE | Admit: 2020-04-21 | Discharge: 2020-04-21 | Disposition: A | Discharge status: Home / Self Care | Payer: Medicare Other | Source: Ambulatory Visit | Attending: Pain Medicine | Admitting: Pain Medicine

## 2020-04-21 DIAGNOSIS — M48061 Spinal stenosis, lumbar region without neurogenic claudication: Secondary | ICD-10-CM | POA: Diagnosis not present

## 2020-04-21 DIAGNOSIS — M5442 Lumbago with sciatica, left side: Secondary | ICD-10-CM

## 2020-04-21 DIAGNOSIS — G8929 Other chronic pain: Secondary | ICD-10-CM

## 2020-04-30 DIAGNOSIS — I10 Essential (primary) hypertension: Secondary | ICD-10-CM | POA: Diagnosis not present

## 2020-04-30 DIAGNOSIS — M47816 Spondylosis without myelopathy or radiculopathy, lumbar region: Secondary | ICD-10-CM | POA: Diagnosis not present

## 2020-04-30 DIAGNOSIS — K8050 Calculus of bile duct without cholangitis or cholecystitis without obstruction: Secondary | ICD-10-CM | POA: Diagnosis not present

## 2020-04-30 DIAGNOSIS — Z6827 Body mass index (BMI) 27.0-27.9, adult: Secondary | ICD-10-CM | POA: Diagnosis not present

## 2020-04-30 DIAGNOSIS — M5136 Other intervertebral disc degeneration, lumbar region: Secondary | ICD-10-CM | POA: Diagnosis not present

## 2020-05-02 ENCOUNTER — Telehealth: Payer: Self-pay

## 2020-05-02 NOTE — Telephone Encounter (Signed)
Primary Cardiologist:James Hochrein, MD  Chart reviewed as part of pre-operative protocol coverage. Because of Hannah Collier's past medical history and time since last visit, he/she will require a follow-up visit in order to better assess preoperative cardiovascular risk.  Pre-op covering staff: - Please schedule appointment and call patient to inform them. - Please contact requesting surgeon's office via preferred method (i.e, phone, fax) to inform them of need for appointment prior to surgery.  If applicable, this message will also be routed to pharmacy pool and/or primary cardiologist for input on holding anticoagulant/antiplatelet agent as requested below so that this information is available at time of patient's appointment.   Deberah Pelton, NP  05/02/2020, 2:22 PM

## 2020-05-02 NOTE — Telephone Encounter (Signed)
Call pt and scheduled appt for cardiac clearance. She will arrive early wearing a mask

## 2020-05-02 NOTE — Telephone Encounter (Signed)
   [facility] Pre-operative Risk Assessment    Request for surgical clearance:  1. What type of surgery is being performed? LUMBAR ESI L5-S1   2. When is this surgery scheduled? TBD   3. What type of clearance is required (medical clearance vs. Pharmacy clearance to hold med vs. Both)? BOTH   4. Are there any medications that need to be held prior to surgery and how long? ELIQUIS 3 DAYS   5. Practice name and name of physician performing surgery? [facility] Clydell Hakim, MD    6. What is the office phone number? [phone]   7.   What is the office fax number? [phone]  8.   Anesthesia type (None, local, MAC, general) ? NOT LISTED-LM2CB

## 2020-05-02 NOTE — Telephone Encounter (Signed)
Patient with diagnosis of afib on Eliquis for anticoagulation.    Procedure: LUMBAR ESI L5-S1  Date of procedure: TBD  CHADS2-VASc score of  2 (AGE, female)  CrCl 61 ml/min  Per office protocol, patient can hold Elqiuis for 3 days prior to procedure.

## 2020-05-13 ENCOUNTER — Other Ambulatory Visit: Payer: Self-pay

## 2020-05-13 MED ORDER — APIXABAN 5 MG PO TABS: 5.0000 mg | ORAL_TABLET | Freq: Two times a day (BID) | ORAL | 1 refills | Status: DC

## 2020-05-14 ENCOUNTER — Other Ambulatory Visit: Payer: Self-pay

## 2020-05-14 ENCOUNTER — Ambulatory Visit: Payer: Medicare Other | Admitting: General Practice

## 2020-05-14 MED ORDER — DIGOXIN 125 MCG PO TABS: 0.1250 mg | ORAL_TABLET | Freq: Every day | ORAL | 2 refills | Status: DC

## 2020-05-14 MED ORDER — METOPROLOL TARTRATE 25 MG PO TABS: 12.5000 mg | ORAL_TABLET | Freq: Every day | ORAL | 6 refills | Status: AC | PRN

## 2020-05-17 ENCOUNTER — Other Ambulatory Visit: Payer: Self-pay | Admitting: *Deleted

## 2020-05-17 MED ORDER — METOPROLOL SUCCINATE ER 25 MG PO TB24: 25.0000 mg | ORAL_TABLET | Freq: Every day | ORAL | 1 refills | Status: DC

## 2020-05-17 NOTE — Telephone Encounter (Signed)
Rx has been sent to the pharmacy electronically. ° °

## 2020-05-21 ENCOUNTER — Telehealth: Payer: Self-pay | Admitting: *Deleted

## 2020-05-21 NOTE — Telephone Encounter (Signed)
Received fax from [place] regarding patient taking Metoprolol Succ 25 mg daily and Metoprolol Tart 25 mg 1/2 tablet as needed. Ok both per 06/26/2019 office note, patient using both

## 2020-06-04 DIAGNOSIS — M545 Low back pain, unspecified: Secondary | ICD-10-CM | POA: Diagnosis not present

## 2020-06-04 DIAGNOSIS — L409 Psoriasis, unspecified: Secondary | ICD-10-CM | POA: Diagnosis not present

## 2020-06-04 DIAGNOSIS — M1120 Other chondrocalcinosis, unspecified site: Secondary | ICD-10-CM | POA: Diagnosis not present

## 2020-06-04 DIAGNOSIS — M2550 Pain in unspecified joint: Secondary | ICD-10-CM | POA: Diagnosis not present

## 2020-06-04 DIAGNOSIS — M150 Primary generalized (osteo)arthritis: Secondary | ICD-10-CM | POA: Diagnosis not present

## 2020-06-04 DIAGNOSIS — E663 Overweight: Secondary | ICD-10-CM | POA: Diagnosis not present

## 2020-06-04 DIAGNOSIS — M154 Erosive (osteo)arthritis: Secondary | ICD-10-CM | POA: Diagnosis not present

## 2020-06-04 DIAGNOSIS — Z6825 Body mass index (BMI) 25.0-25.9, adult: Secondary | ICD-10-CM | POA: Diagnosis not present

## 2020-06-07 ENCOUNTER — Encounter: Payer: Self-pay | Admitting: Nurse Practitioner

## 2020-06-07 DIAGNOSIS — F411 Generalized anxiety disorder: Secondary | ICD-10-CM | POA: Diagnosis not present

## 2020-06-07 DIAGNOSIS — K8020 Calculus of gallbladder without cholecystitis without obstruction: Secondary | ICD-10-CM | POA: Diagnosis not present

## 2020-06-07 DIAGNOSIS — K838 Other specified diseases of biliary tract: Secondary | ICD-10-CM | POA: Diagnosis not present

## 2020-06-07 DIAGNOSIS — Z23 Encounter for immunization: Secondary | ICD-10-CM | POA: Diagnosis not present

## 2020-06-14 DIAGNOSIS — I4891 Unspecified atrial fibrillation: Secondary | ICD-10-CM | POA: Diagnosis not present

## 2020-06-14 DIAGNOSIS — M542 Cervicalgia: Secondary | ICD-10-CM | POA: Diagnosis not present

## 2020-06-14 DIAGNOSIS — M48062 Spinal stenosis, lumbar region with neurogenic claudication: Secondary | ICD-10-CM | POA: Diagnosis not present

## 2020-06-14 DIAGNOSIS — D6869 Other thrombophilia: Secondary | ICD-10-CM | POA: Diagnosis not present

## 2020-06-14 DIAGNOSIS — M5416 Radiculopathy, lumbar region: Secondary | ICD-10-CM | POA: Diagnosis not present

## 2020-06-27 ENCOUNTER — Other Ambulatory Visit (INDEPENDENT_AMBULATORY_CARE_PROVIDER_SITE_OTHER): Payer: Medicare Other

## 2020-06-27 ENCOUNTER — Ambulatory Visit (INDEPENDENT_AMBULATORY_CARE_PROVIDER_SITE_OTHER): Payer: Medicare Other | Admitting: Nurse Practitioner

## 2020-06-27 ENCOUNTER — Encounter: Payer: Self-pay | Admitting: Nurse Practitioner

## 2020-06-27 VITALS — BP 130/90 | HR 66 | Ht 65.0 in | Wt 160.8 lb

## 2020-06-27 DIAGNOSIS — E871 Hypo-osmolality and hyponatremia: Secondary | ICD-10-CM | POA: Diagnosis not present

## 2020-06-27 DIAGNOSIS — K8050 Calculus of bile duct without cholangitis or cholecystitis without obstruction: Secondary | ICD-10-CM

## 2020-06-27 DIAGNOSIS — K8071 Calculus of gallbladder and bile duct without cholecystitis with obstruction: Secondary | ICD-10-CM | POA: Diagnosis not present

## 2020-06-27 DIAGNOSIS — I4819 Other persistent atrial fibrillation: Secondary | ICD-10-CM | POA: Diagnosis not present

## 2020-06-27 DIAGNOSIS — K7460 Unspecified cirrhosis of liver: Secondary | ICD-10-CM | POA: Diagnosis not present

## 2020-06-27 DIAGNOSIS — D649 Anemia, unspecified: Secondary | ICD-10-CM | POA: Diagnosis not present

## 2020-06-27 DIAGNOSIS — Z7901 Long term (current) use of anticoagulants: Secondary | ICD-10-CM

## 2020-06-27 DIAGNOSIS — M5416 Radiculopathy, lumbar region: Secondary | ICD-10-CM | POA: Diagnosis not present

## 2020-06-27 DIAGNOSIS — K8051 Calculus of bile duct without cholangitis or cholecystitis with obstruction: Secondary | ICD-10-CM | POA: Diagnosis not present

## 2020-06-27 LAB — CBC WITH DIFFERENTIAL/PLATELET
Basophils Absolute: 0.1 10*3/uL (ref 0.0–0.1)
Basophils Relative: 0.5 % (ref 0.0–3.0)
Eosinophils Absolute: 0.1 10*3/uL (ref 0.0–0.7)
Eosinophils Relative: 1.3 % (ref 0.0–5.0)
HCT: 32.1 % — ABNORMAL LOW (ref 36.0–46.0)
Hemoglobin: 10.5 g/dL — ABNORMAL LOW (ref 12.0–15.0)
Lymphocytes Relative: 8.5 % — ABNORMAL LOW (ref 12.0–46.0)
Lymphs Abs: 0.9 10*3/uL (ref 0.7–4.0)
MCHC: 32.6 g/dL (ref 30.0–36.0)
MCV: 82.2 fl (ref 78.0–100.0)
Monocytes Absolute: 0.9 10*3/uL (ref 0.1–1.0)
Monocytes Relative: 8.4 % (ref 3.0–12.0)
Neutro Abs: 9 10*3/uL — ABNORMAL HIGH (ref 1.4–7.7)
Neutrophils Relative %: 81.3 % — ABNORMAL HIGH (ref 43.0–77.0)
Platelets: 313 10*3/uL (ref 150.0–400.0)
RBC: 3.91 Mil/uL (ref 3.87–5.11)
RDW: 16.8 % — ABNORMAL HIGH (ref 11.5–15.5)
WBC: 11.1 10*3/uL — ABNORMAL HIGH (ref 4.0–10.5)

## 2020-06-27 LAB — HEPATIC FUNCTION PANEL
ALT: 29 U/L (ref 0–35)
AST: 55 U/L — ABNORMAL HIGH (ref 0–37)
Albumin: 3.3 g/dL — ABNORMAL LOW (ref 3.5–5.2)
Alkaline Phosphatase: 449 U/L — ABNORMAL HIGH (ref 39–117)
Bilirubin, Direct: 2.9 mg/dL — ABNORMAL HIGH (ref 0.0–0.3)
Total Bilirubin: 5.6 mg/dL — ABNORMAL HIGH (ref 0.2–1.2)
Total Protein: 7.3 g/dL (ref 6.0–8.3)

## 2020-06-27 LAB — PROTIME-INR
INR: 1.3 ratio — ABNORMAL HIGH (ref 0.8–1.0)
Prothrombin Time: 14 s — ABNORMAL HIGH (ref 9.6–13.1)

## 2020-06-27 NOTE — Progress Notes (Signed)
ASSESSMENT AND PLAN    # 74 yo female with obstructive jaundice secondary to choledocholithiasis which was an actually an incidental finding on lumber MRI in September 2021 ( see report below). Today's labs notable for bilirubin of 5.6, alk phos 449. She hasn't had any associated abdominal pain. She is non-toxic appearing. WBC 11 --Patient will need ERCP with stone extraction followed by eventual cholecystectomy. 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 Discussed the case with her primary GI, Dr. Havery Moros, as well as our advanced biliary endoscopist, Dr. Rush Landmark. It will be difficult to access ampulla given patient's altered anatomy. Discussed situation with patient. The plan is for her to be evaluated in ED at [facility] tomorrow.    # [place] anemia. Hgb 10.5. Lab resulted after patient left the office today so we didn't discuss in clinic. Probably secondary to to Roux en Y  # Afib, last dose of Eliquis was yesterday.  --Advised not to take today's dose of Eliquis until evaluated at [facility]   # Hx of colon polyps. Three adenomatous / sessile serrated colon polyps ranging 7-14 mm in size removed in Feb 2020. She is on colonoscopy recall list for Feb 2023  HISTORY OF PRESENT ILLNESS     Primary Gastroenterologist : Juneau Cellar, MD   Chief Complaint : fatigue  Hannah Collier is a 74 y.o. female with PMH / [place] significant for,  but not necessarily limited to: arthritis, remote Roux-en-Y, chronic Etoh use, Afib on Eliquis, adenomatous and sessile serrated colon polyps, diverticulosis, PCOS, psoriasis.    We first met patient in the hospital in mid February 2020 when she presented with melena. EGD was unrevealing. Colonoscopy pertinent for diverticulosis.  Three colon polyps removed.  She was readmitted the end of February 2021 with a postpolypectomy bleed.  secoWe have not seen the patient since.  Patient is now referred by PCP for common bile duct dilation and  abnormal liver chemistries.   Patient has been undergoing work-up for lower back pain.  Apparently she is being followed by Neurosurgery.  She had an MRI of the spine in late September 2021 which incidentally showed a CBD of 27 mm as well as choledocholithiasis.  For comparison sake, ultrasound February 2020 showed cholelithiasis and a common bile duct of only 8 mm. Recent labs by her Rheumatologist apparently showed abnormal liver tests (I do not have those results). However I did get copies of labs drawn back in early July 2021 at which time her total bilirubin was 3.6, alk phos 588, ALT 55, AST 138.  Acute viral hepatitis panel July 2021 was negative.  Patient denies abdominal pain.  She has been having generalized itching and endorses dark urine.  Her main complaint is that of fatigue.  She has unintentionally lost several pounds recently.   [place] in Feb 2020 remarkable for choleithiaiis and CBD of 8 mm. A lumbar MRI in late September 2021 incidentially showed choledocholithiasis with intra and extrahepatic bile duct dilation with filling defect at distal CBD measuring 13 mm. CBD is 27 is mm.   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 - Gastric bypass with a normal-sized pouch and intact staple line. Gastrojejunal anastomosis characterized by healthy appearing mucosa and visible sutures. - Small hiatal hernia. - Normal examined jejunum. - No specimens collected.  Feb 2020 colonoscopy for melena and negative EGD -Three 7 to 14 mm polyps in the ascending colon and in the cecum, removed with a hot snare. Resected and retrieved. - Moderate diverticulosis in the left colon. There was narrowing of the colon in association with the diverticular opening. There was evidence of diverticular spasm. There was no evidence of diverticular  bleeding. - The examination was otherwise normal on direct and retroflexion views.  Colon, polyp(s), cecum and ascending - MULTIPLE FRAGMENTS OF TUBULAR ADENOMA. - SESSILE SERRATED POLYP (X3 FRAGMENTS), INDEFINITE FOR DYSPLASIA, SEE COMMENT.  [place] in Feb 2020 remarkable for choleithiaiis and CBD of 8 mm.   04/21/20  lumbar MRI  IMPRESSION: 1. Choledocholithiasis with marked bile duct dilatation that has progressed from a 2020 ultrasound. 2. 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 3. Moderate spinal stenosis at L1-2 and L3-4. 4. Moderate foraminal narrowing on both sides at L3-4 and on the left at L5-S1    Past Medical History:  Diagnosis Date  . Arthritis   . Atrial fibrillation ([place])   . Depression   . Diverticulosis 2020  . Gastric bypass status for obesity   . PCOS (polycystic ovarian syndrome)   . Tricuspid regurgitation      Past Surgical History:  Procedure Laterality Date  . COLONOSCOPY WITH PROPOFOL N/A 09/13/2018   Procedure: COLONOSCOPY WITH PROPOFOL;  Surgeon: Ladene Artist, MD;  Location: [facility] ENDOSCOPY;  Service: Endoscopy;  Laterality: N/A;  . COLONOSCOPY WITH PROPOFOL N/A 09/24/2018   Procedure: COLONOSCOPY WITH PROPOFOL;  Surgeon: Mauri Pole, MD;  Location: [place] ENDOSCOPY;  Service: Endoscopy;  Laterality: N/A;  . ESOPHAGOGASTRODUODENOSCOPY (EGD) WITH PROPOFOL N/A 09/12/2018   Procedure: ESOPHAGOGASTRODUODENOSCOPY (EGD) WITH PROPOFOL;  Surgeon: Ladene Artist, MD;  Location: [facility] ENDOSCOPY;  Service: Endoscopy;  Laterality: N/A;  . POLYPECTOMY  09/13/2018   Procedure: POLYPECTOMY;  Surgeon: Ladene Artist, MD;  Location: [facility] ENDOSCOPY;  Service: Endoscopy;;  . REPLACEMENT TOTAL KNEE    . TONSILLECTOMY     Family History  Problem Relation Age of Onset  . COPD Mother        Did not know her history  . Throat cancer Father    Social History   Tobacco Use  . Smoking status: Never Smoker  . Smokeless tobacco: Never Used  Vaping Use    . Vaping Use: Never used  Substance Use Topics  . Alcohol use: Yes  . Drug use: Not on file   Current Outpatient Medications  Medication Sig Dispense Refill  . acetaminophen (TYLENOL) 500 MG tablet Take 500 mg by mouth every 6 (six) hours as needed.    Marland Kitchen apixaban (ELIQUIS) 5 MG TABS tablet Take 1 tablet (5 mg total) by mouth 2 (two) times daily. 180 tablet 1  . cetirizine (KLS ALLER-TEC) 10 MG tablet Take 10 mg by mouth daily.    . digoxin (LANOXIN) 0.125 MG tablet Take 1 tablet (0.125 mg total) by mouth daily. 90 tablet 2  . metoprolol succinate (TOPROL-XL) 25 MG 24 hr tablet Take 1 tablet (25 mg total) by mouth daily. Take with or immediately following a meal. 90 tablet 1  . metoprolol tartrate (LOPRESSOR) 25 MG tablet Take 0.5 tablets (12.5 mg total) by mouth daily as needed. 30 tablet 6   No current facility-administered medications for this visit.   Allergies  Allergen Reactions  . Sulfa Antibiotics Rash     Review of Systems: Positive for allergies, anxiety, arthritis, back pain, fatigue, itching, hearing problems,  sleeping problems, swelling of ankles, excessive thirst at night, urine leakage.   All other systems reviewed and negative except where noted in HPI.   PHYSICAL EXAM :    Wt Readings from Last 3 Encounters:  06/27/20 160 lb 12.8 oz (72.9 kg)  09/01/19 188 lb 9.6 oz (85.5 kg)  06/26/19 187 lb (84.8 kg)    BP 130/90 (BP Location: Left Arm, Patient Position: Sitting)   Pulse 66   Ht 5' 5"  (1.651 m)   Wt 160 lb 12.8 oz (72.9 kg)   SpO2 99%   BMI 26.76 kg/m  Constitutional:  Pleasant female in no acute distress. Psychiatric: Normal mood and affect. Behavior is normal. EENT: Pupils normal.  Conjunctivae are normal. No scleral icterus. Neck supple.  Cardiovascular: Normal rate, regular rhythm. No edema Pulmonary/chest: Effort normal and breath sounds normal. No wheezing, rales or rhonchi. Abdominal: Soft, nondistended, nontender. Bowel sounds active  throughout. There are no masses palpable. No hepatomegaly. Neurological: Alert and oriented to person place and time. Skin: jaundiced  Tye Savoy, NP  06/27/2020, 10:01 AM  Cc:  Referring Provider Gaynelle Arabian, MD

## 2020-06-27 NOTE — Patient Instructions (Signed)
If you are age 73 or older, your body mass index should be between 23-30. Your Body mass index is 26.76 kg/m. If this is out of the aforementioned range listed, please consider follow up with your Primary Care Provider.  If you are age 70 or younger, your body mass index should be between 19-25. Your Body mass index is 26.76 kg/m. If this is out of the aformentioned range listed, please consider follow up with your Primary Care Provider.    Your provider has requested that you go to the basement level for lab work before leaving today. Press "B" on the elevator. The lab is located at the first door on the left as you exit the elevator.  Due to recent changes in healthcare laws, you may see the results of your imaging and laboratory studies on MyChart before your provider has had a chance to review them.  We understand that in some cases there may be results that are confusing or concerning to you. Not all laboratory results come back in the same time frame and the provider may be waiting for multiple results in order to interpret others.  Please give [place] 48 hours in order for your provider to thoroughly review all the results before contacting the office for clarification of your results.   We will call you with further instructions following labs.

## 2020-06-28 DIAGNOSIS — D649 Anemia, unspecified: Secondary | ICD-10-CM | POA: Diagnosis present

## 2020-06-28 DIAGNOSIS — F411 Generalized anxiety disorder: Secondary | ICD-10-CM | POA: Diagnosis present

## 2020-06-28 DIAGNOSIS — Z800 Family history of malignant neoplasm of digestive organs: Secondary | ICD-10-CM | POA: Diagnosis not present

## 2020-06-28 DIAGNOSIS — Z882 Allergy status to sulfonamides status: Secondary | ICD-10-CM | POA: Diagnosis not present

## 2020-06-28 DIAGNOSIS — K8071 Calculus of gallbladder and bile duct without cholecystitis with obstruction: Secondary | ICD-10-CM | POA: Diagnosis present

## 2020-06-28 DIAGNOSIS — Z7901 Long term (current) use of anticoagulants: Secondary | ICD-10-CM | POA: Diagnosis not present

## 2020-06-28 DIAGNOSIS — Z9049 Acquired absence of other specified parts of digestive tract: Secondary | ICD-10-CM | POA: Diagnosis not present

## 2020-06-28 DIAGNOSIS — R1084 Generalized abdominal pain: Secondary | ICD-10-CM | POA: Diagnosis not present

## 2020-06-28 DIAGNOSIS — Z9884 Bariatric surgery status: Secondary | ICD-10-CM | POA: Diagnosis not present

## 2020-06-28 DIAGNOSIS — K8010 Calculus of gallbladder with chronic cholecystitis without obstruction: Secondary | ICD-10-CM | POA: Diagnosis not present

## 2020-06-28 DIAGNOSIS — E871 Hypo-osmolality and hyponatremia: Secondary | ICD-10-CM | POA: Diagnosis present

## 2020-06-28 DIAGNOSIS — K7460 Unspecified cirrhosis of liver: Secondary | ICD-10-CM | POA: Diagnosis present

## 2020-06-28 DIAGNOSIS — I4819 Other persistent atrial fibrillation: Secondary | ICD-10-CM | POA: Diagnosis not present

## 2020-06-28 DIAGNOSIS — M5416 Radiculopathy, lumbar region: Secondary | ICD-10-CM | POA: Diagnosis not present

## 2020-06-28 DIAGNOSIS — K838 Other specified diseases of biliary tract: Secondary | ICD-10-CM | POA: Diagnosis not present

## 2020-06-28 DIAGNOSIS — Z96653 Presence of artificial knee joint, bilateral: Secondary | ICD-10-CM | POA: Diagnosis present

## 2020-06-28 DIAGNOSIS — Z825 Family history of asthma and other chronic lower respiratory diseases: Secondary | ICD-10-CM | POA: Diagnosis not present

## 2020-06-28 DIAGNOSIS — Z20822 Contact with and (suspected) exposure to covid-19: Secondary | ICD-10-CM | POA: Diagnosis present

## 2020-06-28 DIAGNOSIS — K8050 Calculus of bile duct without cholangitis or cholecystitis without obstruction: Secondary | ICD-10-CM | POA: Diagnosis not present

## 2020-06-28 NOTE — Progress Notes (Signed)
Agree with assessment with the following thoughts: Unfortunately this patient was booked for a routine consult without [place] being aware of her prior imaging from September. She appears to have had choledocholithiasis for at least a few months, not having much pain, but she is not feeling well, jaundiced with elevated liver enzymes and mildly elevated WBC, at risk for cholangitis. She warrants hospital admission for ERCP. Unfortunately complicating her case is her history of Roux-en-y gastric bypass, we can cannot do a routine ERCP for her with that anatomy. Her case was discussed with Dr. Rush Landmark covering advanced endoscopy this week, her case exceeds the level of care we can provide at [facility], he recommended the patient go to another tertiary care facility who has the capability to perform ERCP with her altered anatomy, which will likely require surgical assistance. In this light, recommended the patient have her family take her to either [facility], or [facility] ED as soon as they are able to. She is hemodynamically stable and non toxic appearing currently, but at risk for cholangitis and clinical worsening if this is not addressed soon.

## 2020-06-29 DIAGNOSIS — K8050 Calculus of bile duct without cholangitis or cholecystitis without obstruction: Secondary | ICD-10-CM | POA: Diagnosis not present

## 2020-06-30 DIAGNOSIS — K8050 Calculus of bile duct without cholangitis or cholecystitis without obstruction: Secondary | ICD-10-CM | POA: Diagnosis not present

## 2020-07-01 DIAGNOSIS — I4819 Other persistent atrial fibrillation: Secondary | ICD-10-CM | POA: Diagnosis not present

## 2020-07-01 DIAGNOSIS — M5416 Radiculopathy, lumbar region: Secondary | ICD-10-CM | POA: Diagnosis not present

## 2020-07-01 DIAGNOSIS — R1084 Generalized abdominal pain: Secondary | ICD-10-CM | POA: Diagnosis not present

## 2020-07-01 DIAGNOSIS — K8010 Calculus of gallbladder with chronic cholecystitis without obstruction: Secondary | ICD-10-CM | POA: Diagnosis not present

## 2020-07-01 DIAGNOSIS — K8050 Calculus of bile duct without cholangitis or cholecystitis without obstruction: Secondary | ICD-10-CM | POA: Diagnosis not present

## 2020-07-03 DIAGNOSIS — K8050 Calculus of bile duct without cholangitis or cholecystitis without obstruction: Secondary | ICD-10-CM | POA: Diagnosis not present

## 2020-07-07 DIAGNOSIS — E440 Moderate protein-calorie malnutrition: Secondary | ICD-10-CM | POA: Diagnosis not present

## 2020-07-07 DIAGNOSIS — D649 Anemia, unspecified: Secondary | ICD-10-CM | POA: Diagnosis not present

## 2020-07-07 DIAGNOSIS — Z9884 Bariatric surgery status: Secondary | ICD-10-CM | POA: Diagnosis not present

## 2020-07-07 DIAGNOSIS — Z6827 Body mass index (BMI) 27.0-27.9, adult: Secondary | ICD-10-CM | POA: Diagnosis not present

## 2020-07-07 DIAGNOSIS — Z9049 Acquired absence of other specified parts of digestive tract: Secondary | ICD-10-CM | POA: Diagnosis not present

## 2020-07-07 DIAGNOSIS — F411 Generalized anxiety disorder: Secondary | ICD-10-CM | POA: Diagnosis not present

## 2020-07-07 DIAGNOSIS — M5416 Radiculopathy, lumbar region: Secondary | ICD-10-CM | POA: Diagnosis not present

## 2020-07-07 DIAGNOSIS — Z79899 Other long term (current) drug therapy: Secondary | ICD-10-CM | POA: Diagnosis not present

## 2020-07-07 DIAGNOSIS — Z7901 Long term (current) use of anticoagulants: Secondary | ICD-10-CM | POA: Diagnosis not present

## 2020-07-07 DIAGNOSIS — Z48815 Encounter for surgical aftercare following surgery on the digestive system: Secondary | ICD-10-CM | POA: Diagnosis not present

## 2020-07-07 DIAGNOSIS — I4819 Other persistent atrial fibrillation: Secondary | ICD-10-CM | POA: Diagnosis not present

## 2020-07-09 DIAGNOSIS — K8050 Calculus of bile duct without cholangitis or cholecystitis without obstruction: Secondary | ICD-10-CM | POA: Diagnosis not present

## 2020-07-09 DIAGNOSIS — G8918 Other acute postprocedural pain: Secondary | ICD-10-CM | POA: Diagnosis not present

## 2020-07-09 DIAGNOSIS — F411 Generalized anxiety disorder: Secondary | ICD-10-CM | POA: Diagnosis not present

## 2020-07-09 DIAGNOSIS — K7460 Unspecified cirrhosis of liver: Secondary | ICD-10-CM | POA: Diagnosis not present

## 2020-07-09 DIAGNOSIS — K838 Other specified diseases of biliary tract: Secondary | ICD-10-CM | POA: Diagnosis not present

## 2020-07-11 DIAGNOSIS — M542 Cervicalgia: Secondary | ICD-10-CM | POA: Diagnosis not present

## 2020-07-18 DIAGNOSIS — M542 Cervicalgia: Secondary | ICD-10-CM | POA: Diagnosis not present

## 2020-07-18 DIAGNOSIS — M5416 Radiculopathy, lumbar region: Secondary | ICD-10-CM | POA: Diagnosis not present

## 2020-07-25 DIAGNOSIS — K8050 Calculus of bile duct without cholangitis or cholecystitis without obstruction: Secondary | ICD-10-CM | POA: Diagnosis not present

## 2020-08-10 DIAGNOSIS — M5416 Radiculopathy, lumbar region: Secondary | ICD-10-CM | POA: Diagnosis not present

## 2020-08-28 ENCOUNTER — Ambulatory Visit: Payer: Medicare Other | Admitting: Gastroenterology

## 2020-09-06 DIAGNOSIS — M5416 Radiculopathy, lumbar region: Secondary | ICD-10-CM | POA: Diagnosis not present

## 2020-09-11 NOTE — Progress Notes (Signed)
Cardiology Office Note   Date:  09/13/2020   ID:  Scotty Pinder, DOB 01/06/1946, MRN [id number]  PCP:  Gaynelle Arabian, MD  Cardiologist:   Minus Breeding, MD   Chief Complaint  Patient presents with  . Weakness      History of Present Illness: Hannah Collier is a 75 y.o. female who presents for follow up of atrial fib.  Since I last saw her she was at [facility].  She had cholecystitis and choledocholithiasis and had to have cholecystectomy.  She was malnourished by the report.  I reviewed these records for this visit.  I do note that her hemoglobin was down to 8 when it was last checked in December.  She has not had any GI bleeding.  She is limited walking with a cane because of back pain now.  She is fatigued.  She does get breathless with activities but she is not describing resting shortness of breath, PND or orthopnea.  She is not had any presyncope or syncope.  She is rarely having to take as needed beta-blockers on top of her low-dose scheduled beta-blockers.  During her [facility] hospitalization there were no reported cardiac issues.  She is chronically in atrial fibrillation.  Past Medical History:  Diagnosis Date  . Arthritis   . Atrial fibrillation ([place])   . Depression   . Diverticulosis 2020  . Gastric bypass status for obesity   . PCOS (polycystic ovarian syndrome)   . Tricuspid regurgitation     Past Surgical History:  Procedure Laterality Date  . COLONOSCOPY WITH PROPOFOL N/A 09/13/2018   Procedure: COLONOSCOPY WITH PROPOFOL;  Surgeon: Ladene Artist, MD;  Location: [facility] ENDOSCOPY;  Service: Endoscopy;  Laterality: N/A;  . COLONOSCOPY WITH PROPOFOL N/A 09/24/2018   Procedure: COLONOSCOPY WITH PROPOFOL;  Surgeon: Mauri Pole, MD;  Location: [place] ENDOSCOPY;  Service: Endoscopy;  Laterality: N/A;  . ESOPHAGOGASTRODUODENOSCOPY (EGD) WITH PROPOFOL N/A 09/12/2018   Procedure: ESOPHAGOGASTRODUODENOSCOPY (EGD) WITH PROPOFOL;  Surgeon: Ladene Artist, MD;  Location: [facility]  ENDOSCOPY;  Service: Endoscopy;  Laterality: N/A;  . POLYPECTOMY  09/13/2018   Procedure: POLYPECTOMY;  Surgeon: Ladene Artist, MD;  Location: [facility] ENDOSCOPY;  Service: Endoscopy;;  . REPLACEMENT TOTAL KNEE    . TONSILLECTOMY       Current Outpatient Medications  Medication Sig Dispense Refill  . acetaminophen (TYLENOL) 500 MG tablet Take 500 mg by mouth every 6 (six) hours as needed.    Marland Kitchen apixaban (ELIQUIS) 5 MG TABS tablet Take 1 tablet (5 mg total) by mouth 2 (two) times daily. 180 tablet 1  . cetirizine (ZYRTEC) 10 MG tablet Take 10 mg by mouth daily.    . digoxin (LANOXIN) 0.125 MG tablet Take 1 tablet (0.125 mg total) by mouth daily. 90 tablet 2  . hydrochloric acid, bulk, 10 % LIQD     . hydrOXYzine (VISTARIL) 25 MG/ML injection Inject into the muscle.    . metoprolol tartrate (LOPRESSOR) 25 MG tablet Take 0.5 tablets (12.5 mg total) by mouth daily as needed. 30 tablet 6  . oxyCODONE (OXY IR/ROXICODONE) 5 MG immediate release tablet Take 5 mg by mouth 3 (three) times daily as needed.    . metoprolol succinate (TOPROL-XL) 25 MG 24 hr tablet Take 0.5 tablets (12.5 mg total) by mouth daily. Take with or immediately following a meal. 45 tablet 3  . sertraline (ZOLOFT) 50 MG tablet Take 50 mg by mouth daily. (Patient not taking: Reported on 09/13/2020)     No  current facility-administered medications for this visit.    Allergies:   Sulfa antibiotics     ROS:  Please see the history of present illness.   Otherwise, review of systems are positive for arthritis.   All other systems are reviewed and negative.    PHYSICAL EXAM: VS:  BP 123/63   Pulse (!) 55   Ht 5\' 5"  (1.651 m)   Wt 161 lb 3.2 oz (73.1 kg)   SpO2 99%   BMI 26.83 kg/m  , BMI Body mass index is 26.83 kg/m. GENERAL:  Well appearing NECK: Positive jugular venous distention to the jaw with a prominent CV wave carotid upstroke brisk and symmetric, no bruits, no thyromegaly LUNGS:  Clear to auscultation  bilaterally CHEST:  Unremarkable HEART:  PMI not displaced or sustained,S1 and S2 within normal limits, no S3, no clicks, no rubs, 3 out of 6 holosystolic murmur heard best at the third left intercostal space murmurs, irregular ABD:  Flat, positive bowel sounds normal in frequency in pitch, no bruits, no rebound, no guarding, no midline pulsatile mass, no hepatomegaly, no splenomegaly EXT:  2 plus pulses throughout, mild left leg edema, no cyanosis no clubbing    EKG:  EKG is  ordered today. Atrial fibrillation, rate 55, leftward axis, poor anterior R wave progression, nonspecific ST-T wave.  Recent Labs: 06/27/2020: ALT 29; Hemoglobin 10.5; Platelets 313.0    Lipid Panel No results found for: CHOL, TRIG, HDL, CHOLHDL, VLDL, LDLCALC, LDLDIRECT    Wt Readings from Last 3 Encounters:  09/13/20 161 lb 3.2 oz (73.1 kg)  06/27/20 160 lb 12.8 oz (72.9 kg)  09/01/19 188 lb 9.6 oz (85.5 kg)      Other studies Reviewed: Additional studies/ records that were reviewed today include: [facility] records Review of the above records demonstrates:  Please see elsewhere in the note.     ASSESSMENT AND PLAN:  PERSISTENT ATRIAL FIB:    Her rate is actually slow and so I am going to back off on her beta-blocker to 12.5 mg daily with the as needed staying where it is.  She will call me in about 3 weeks and I might be able to discontinue the scheduled metoprolol altogether.  She will continue with anticoagulation.  GI BLEED:    She is not having any GI bleeding by her report but she is significantly anemic probably related to chronic disease.  She is going to see GI for continued follow-up and abnormal appearing liver during her surgery.  She has follow-up with Gaynelle Arabian, MD for management of her chronic medical issues and can get a CBC at that time.   TR:   She has had elevated pulmonary pressures of 45 mm with his TR.  I will reassess with an echocardiogram though she is not having any overt symptoms  related to this.  Current medicines are reviewed at length with the patient today.  The patient does not have concerns regarding medicines.  The following changes have been made: As above  Labs/ tests ordered today include:     Orders Placed This Encounter  Procedures  . EKG 12-Lead  . ECHOCARDIOGRAM COMPLETE     Disposition:   FU with me in 6 months.    Signed, Minus Breeding, MD  09/13/2020 11:39 AM    [facility]

## 2020-09-13 ENCOUNTER — Other Ambulatory Visit: Payer: Self-pay

## 2020-09-13 ENCOUNTER — Encounter: Payer: Self-pay | Admitting: Cardiology

## 2020-09-13 ENCOUNTER — Ambulatory Visit (INDEPENDENT_AMBULATORY_CARE_PROVIDER_SITE_OTHER): Payer: Medicare Other | Admitting: Cardiology

## 2020-09-13 VITALS — BP 123/63 | HR 55 | Ht 65.0 in | Wt 161.2 lb

## 2020-09-13 DIAGNOSIS — I272 Pulmonary hypertension, unspecified: Secondary | ICD-10-CM

## 2020-09-13 DIAGNOSIS — I361 Nonrheumatic tricuspid (valve) insufficiency: Secondary | ICD-10-CM | POA: Diagnosis not present

## 2020-09-13 DIAGNOSIS — I4819 Other persistent atrial fibrillation: Secondary | ICD-10-CM

## 2020-09-13 MED ORDER — METOPROLOL SUCCINATE ER 25 MG PO TB24: 12.5000 mg | ORAL_TABLET | Freq: Every day | ORAL | 3 refills | Status: DC

## 2020-09-13 NOTE — Patient Instructions (Signed)
Medication Instructions:  Decrease Metoprolol Succinate to 12.5mg  daily *If you need a refill on your cardiac medications before your next appointment, please call your pharmacy*  Testing/Procedures: Your physician has requested that you have an echocardiogram. Echocardiography is a painless test that uses sound waves to create images of your heart. It provides your doctor with information about the size and shape of your heart and how well your heart's chambers and valves are working. This procedure takes approximately one hour. There are no restrictions for this procedure. [place]  Follow-Up: At [facility], you and your health needs are our priority.  As part of our continuing mission to provide you with exceptional heart care, we have created designated Provider Care Teams.  These Care Teams include your primary Cardiologist (physician) and Advanced Practice Providers (APPs -  Physician Assistants and Nurse Practitioners) who all work together to provide you with the care you need, when you need it.  We recommend signing up for the patient portal called "MyChart".  Sign up information is provided on this After Visit Summary.  MyChart is used to connect with patients for Virtual Visits (Telemedicine).  Patients are able to view lab/test results, encounter notes, upcoming appointments, etc.  Non-urgent messages can be sent to your provider as well.   To learn more about what you can do with MyChart, go to [email or web address].    Your next appointment:   6 month(s) You will receive a reminder letter in the mail two months in advance. If you don't receive a letter, please call our office to schedule the follow-up appointment.  The format for your next appointment:   In Person  Provider:   Minus Breeding, MD

## 2020-09-24 ENCOUNTER — Other Ambulatory Visit: Payer: Self-pay

## 2020-09-24 ENCOUNTER — Encounter (HOSPITAL_COMMUNITY): Payer: Self-pay | Admitting: Emergency Medicine

## 2020-09-24 ENCOUNTER — Ambulatory Visit (INDEPENDENT_AMBULATORY_CARE_PROVIDER_SITE_OTHER): Payer: Medicare Other | Admitting: Gastroenterology

## 2020-09-24 ENCOUNTER — Observation Stay (HOSPITAL_COMMUNITY)
Admission: EM | Admit: 2020-09-24 | Discharge: 2020-09-25 | Disposition: A | Discharge status: Home / Self Care | Payer: Medicare Other | Attending: Internal Medicine | Admitting: Internal Medicine

## 2020-09-24 ENCOUNTER — Other Ambulatory Visit (INDEPENDENT_AMBULATORY_CARE_PROVIDER_SITE_OTHER): Payer: Medicare Other

## 2020-09-24 ENCOUNTER — Encounter: Payer: Self-pay | Admitting: Gastroenterology

## 2020-09-24 VITALS — BP 142/70 | HR 91 | Ht 65.0 in | Wt 164.0 lb

## 2020-09-24 DIAGNOSIS — R739 Hyperglycemia, unspecified: Secondary | ICD-10-CM | POA: Insufficient documentation

## 2020-09-24 DIAGNOSIS — R799 Abnormal finding of blood chemistry, unspecified: Secondary | ICD-10-CM | POA: Diagnosis present

## 2020-09-24 DIAGNOSIS — D649 Anemia, unspecified: Secondary | ICD-10-CM

## 2020-09-24 DIAGNOSIS — Z20822 Contact with and (suspected) exposure to covid-19: Secondary | ICD-10-CM | POA: Insufficient documentation

## 2020-09-24 DIAGNOSIS — K8050 Calculus of bile duct without cholangitis or cholecystitis without obstruction: Secondary | ICD-10-CM

## 2020-09-24 DIAGNOSIS — Z96659 Presence of unspecified artificial knee joint: Secondary | ICD-10-CM | POA: Insufficient documentation

## 2020-09-24 DIAGNOSIS — R600 Localized edema: Secondary | ICD-10-CM | POA: Diagnosis not present

## 2020-09-24 DIAGNOSIS — Z9884 Bariatric surgery status: Secondary | ICD-10-CM

## 2020-09-24 DIAGNOSIS — R932 Abnormal findings on diagnostic imaging of liver and biliary tract: Secondary | ICD-10-CM | POA: Diagnosis not present

## 2020-09-24 DIAGNOSIS — D631 Anemia in chronic kidney disease: Secondary | ICD-10-CM | POA: Insufficient documentation

## 2020-09-24 DIAGNOSIS — Z7901 Long term (current) use of anticoagulants: Secondary | ICD-10-CM | POA: Diagnosis not present

## 2020-09-24 DIAGNOSIS — N182 Chronic kidney disease, stage 2 (mild): Secondary | ICD-10-CM | POA: Diagnosis not present

## 2020-09-24 DIAGNOSIS — E871 Hypo-osmolality and hyponatremia: Secondary | ICD-10-CM | POA: Insufficient documentation

## 2020-09-24 DIAGNOSIS — M5416 Radiculopathy, lumbar region: Secondary | ICD-10-CM | POA: Diagnosis not present

## 2020-09-24 DIAGNOSIS — Z860100 Personal history of colon polyps, unspecified: Secondary | ICD-10-CM

## 2020-09-24 DIAGNOSIS — I480 Paroxysmal atrial fibrillation: Secondary | ICD-10-CM | POA: Diagnosis present

## 2020-09-24 LAB — COMPREHENSIVE METABOLIC PANEL
ALT: 7 U/L (ref 0–35)
ALT: 9 U/L (ref 0–44)
AST: 14 U/L (ref 0–37)
AST: 18 U/L (ref 15–41)
Albumin: 3.9 g/dL (ref 3.5–5.2)
Albumin: 3.4 g/dL — ABNORMAL LOW (ref 3.5–5.0)
Alkaline Phosphatase: 69 U/L (ref 39–117)
Alkaline Phosphatase: 60 U/L (ref 38–126)
Anion gap: 8 (ref 5–15)
BUN: 11 mg/dL (ref 6–23)
BUN: 10 mg/dL (ref 8–23)
CO2: 25 mEq/L (ref 19–32)
CO2: 23 mmol/L (ref 22–32)
Calcium: 9.3 mg/dL (ref 8.4–10.5)
Calcium: 9 mg/dL (ref 8.9–10.3)
Chloride: 97 mEq/L (ref 96–112)
Chloride: 99 mmol/L (ref 98–111)
Creatinine, Ser: 0.87 mg/dL (ref 0.40–1.20)
Creatinine, Ser: 1.02 mg/dL — ABNORMAL HIGH (ref 0.44–1.00)
GFR, Estimated: 58 mL/min — ABNORMAL LOW (ref >60)
GFR: 65.79 mL/min (ref >60.00)
Glucose, Bld: 144 mg/dL — ABNORMAL HIGH (ref 70–99)
Glucose, Bld: 276 mg/dL — ABNORMAL HIGH (ref 70–99)
Potassium: 4.2 mEq/L (ref 3.5–5.1)
Potassium: 4.6 mmol/L (ref 3.5–5.1)
Sodium: 128 mEq/L — ABNORMAL LOW (ref 135–145)
Sodium: 130 mmol/L — ABNORMAL LOW (ref 135–145)
Total Bilirubin: 0.8 mg/dL (ref 0.2–1.2)
Total Bilirubin: 0.8 mg/dL (ref 0.3–1.2)
Total Protein: 7.2 g/dL (ref 6.0–8.3)
Total Protein: 6.6 g/dL (ref 6.5–8.1)

## 2020-09-24 LAB — CBC WITH DIFFERENTIAL/PLATELET
Abs Immature Granulocytes: 0.01 10*3/uL (ref 0.00–0.07)
Basophils Absolute: 0 10*3/uL (ref 0.0–0.1)
Basophils Absolute: 0 10*3/uL (ref 0.0–0.1)
Basophils Relative: 0.4 % (ref 0.0–3.0)
Basophils Relative: 0 %
Eosinophils Absolute: 0 10*3/uL (ref 0.0–0.7)
Eosinophils Absolute: 0 10*3/uL (ref 0.0–0.5)
Eosinophils Relative: 0 % (ref 0.0–5.0)
Eosinophils Relative: 0 %
HCT: 19.5 % — CL (ref 36.0–46.0)
HCT: 18.6 % — ABNORMAL LOW (ref 36.0–46.0)
Hemoglobin: 6.2 g/dL — CL (ref 12.0–15.0)
Hemoglobin: 5.7 g/dL — CL (ref 12.0–15.0)
Immature Granulocytes: 1 %
Lymphocytes Relative: 8.1 % — ABNORMAL LOW (ref 12.0–46.0)
Lymphocytes Relative: 13 %
Lymphs Abs: 0.3 10*3/uL — ABNORMAL LOW (ref 0.7–4.0)
Lymphs Abs: 0.3 10*3/uL — ABNORMAL LOW (ref 0.7–4.0)
MCH: 22.3 pg — ABNORMAL LOW (ref 26.0–34.0)
MCHC: 31.9 g/dL (ref 30.0–36.0)
MCHC: 30.6 g/dL (ref 30.0–36.0)
MCV: 70.4 fl — ABNORMAL LOW (ref 78.0–100.0)
MCV: 72.7 fL — ABNORMAL LOW (ref 80.0–100.0)
Monocytes Absolute: 0.1 10*3/uL (ref 0.1–1.0)
Monocytes Absolute: 0 10*3/uL — ABNORMAL LOW (ref 0.1–1.0)
Monocytes Relative: 2 % — ABNORMAL LOW (ref 3.0–12.0)
Monocytes Relative: 1 %
Neutro Abs: 3.6 10*3/uL (ref 1.4–7.7)
Neutro Abs: 1.9 10*3/uL (ref 1.7–7.7)
Neutrophils Relative %: 89.5 % — ABNORMAL HIGH (ref 43.0–77.0)
Neutrophils Relative %: 85 %
Platelets: 203 10*3/uL (ref 150.0–400.0)
Platelets: 172 10*3/uL (ref 150–400)
RBC: 2.77 Mil/uL — ABNORMAL LOW (ref 3.87–5.11)
RBC: 2.56 MIL/uL — ABNORMAL LOW (ref 3.87–5.11)
RDW: 18.2 % — ABNORMAL HIGH (ref 11.5–15.5)
RDW: 17.6 % — ABNORMAL HIGH (ref 11.5–15.5)
WBC: 4 10*3/uL (ref 4.0–10.5)
WBC: 2.1 10*3/uL — ABNORMAL LOW (ref 4.0–10.5)
nRBC: 0 % (ref 0.0–0.2)

## 2020-09-24 LAB — PROTIME-INR
INR: 1.5 ratio — ABNORMAL HIGH (ref 0.8–1.0)
Prothrombin Time: 16.5 s — ABNORMAL HIGH (ref 9.6–13.1)

## 2020-09-24 LAB — RETICULOCYTES
Immature Retic Fract: 18.5 % — ABNORMAL HIGH (ref 2.3–15.9)
RBC.: 2.81 MIL/uL — ABNORMAL LOW (ref 3.87–5.11)
Retic Count, Absolute: 32.9 10*3/uL (ref 19.0–186.0)
Retic Ct Pct: 1.2 % (ref 0.4–3.1)

## 2020-09-24 LAB — VITAMIN B12: Vitamin B-12: 346 pg/mL (ref 180–914)

## 2020-09-24 LAB — IRON AND TIBC
Iron: 11 ug/dL — ABNORMAL LOW (ref 28–170)
Saturation Ratios: 3 % — ABNORMAL LOW (ref 10.4–31.8)
TIBC: 416 ug/dL (ref 250–450)
UIBC: 405 ug/dL

## 2020-09-24 LAB — FERRITIN: Ferritin: 4 ng/mL — ABNORMAL LOW (ref 11–307)

## 2020-09-24 LAB — PREPARE RBC (CROSSMATCH)

## 2020-09-24 LAB — FOLATE: Folate: 8.3 ng/mL (ref >5.9)

## 2020-09-24 LAB — POC OCCULT BLOOD, ED: Fecal Occult Bld: NEGATIVE

## 2020-09-24 MED ORDER — SODIUM CHLORIDE 0.9 % IV SOLN
10.0000 mL/h | Freq: Once | INTRAVENOUS | Status: AC
  Administered 2020-09-24: 10 mL/h via INTRAVENOUS

## 2020-09-24 MED ORDER — FERROUS SULFATE 324 (65 FE) MG PO TBEC: 324.0000 mg | DELAYED_RELEASE_TABLET | Freq: Every day | ORAL | 3 refills | Status: AC

## 2020-09-24 NOTE — Progress Notes (Signed)
HPI :  75 year old female here for follow-up visit.  She has a history of remote Roux-en-Y, history of alcohol use, A. fib on Eliquis, history of iron deficiency anemia and GI bleeding in 2020.  She was seen in our office on December 2 by Tye Savoy PA.  At that point in time she was followed by neurosurgery for lower back pain.  She had an MRI of the spine in September 2021 which showed a CBD of 27 mm as well as choledocholithiasis.  At that point in time her bilirubin was 5.6, alk phos 449, AST 55, ALT 29.  She had a white blood cell count of 11.1.  We discussed her case with our advanced endoscopist and it was recommended that she go to a tertiary care facility for management of this as we are unable to do ERCP and gastric bypass patients at her hospital.  She went to [facility] that day and presented to the emergency department.  She was admitted to the hospital and underwent a combined intraoperative ERCP with cholecystectomy.  During that procedure she was reported to have " intraoperative findings suggestive of cirrhosis", although no liver biopsy was performed.  On imaging studies she has not had any evidence of cirrhosis, her spleen is normal.  She is unaware of any prior history of cirrhosis.  She has drank 2 to 3 glasses of wine per day for 6 to 7 years but has not had any alcohol since last June.  She states back in 2011 during her surgery she had a exposure to viral hepatitis and was tested at that time was negative.  She states she has recovered okay from these procedures however feels extremely fatigued.  She states she feels as though she is anemic.  She continues to take Eliquis for atrial fibrillation.  She is having brown stools, she does not have any overt blood loss that she is aware of.  She has not been taking iron supplementation. She was admitted in February 2020 for anemia and dark stools, she underwent an EGD and colonoscopy at that time which did not show any significant  pathology other than multiple right-sided polyps, the largest was 14 mm adenoma.  She unfortunately had a post polypectomy bleed that required endoscopic intervention shortly thereafter.   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 - Gastric bypass with a normal-sized pouch and intact staple line. Gastrojejunal anastomosis characterized by healthy appearing mucosa and visible sutures. - Small hiatal hernia. - Normal examined jejunum. - No specimens collected.  Feb 2020 colonoscopy for melena and negative EGD -Three 7 to 14 mm polyps in the ascending colon and in the cecum, removed with a hot snare. Resected and retrieved. - Moderate diverticulosis in the left colon. There was narrowing of the colon in association with the diverticular opening. There was evidence of diverticular spasm. There was no evidence of diverticular bleeding. - The examination was otherwise normal on direct and retroflexion views.  Colon, polyp(s), cecum and ascending - MULTIPLE FRAGMENTS OF TUBULAR ADENOMA. - SESSILE SERRATED POLYP (X3 FRAGMENTS), INDEFINITE FOR DYSPLASIA, SEE COMMENT.  [place] in Feb 2020 remarkable for choleithiaiis and CBD of 8 mm.   04/21/20  lumbar MRI  IMPRESSION: 1. Choledocholithiasis with marked bile duct dilatation that has progressed from a 2020 ultrasound. 2. 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 3. Moderate spinal stenosis at L1-2 and L3-4. 4. Moderate foraminal narrowing on both sides at  L3-4 and on the left at L5-S1  On 07/01/20 - Patient was taken to the operating room on 07/01/2020 by Wray Kearns MD and Obie Dredge MD who performed a laparoscopic assisted ERCP and cholecystectomy . Intraop findings notable for cirrhosis  CT scan 06/27/20 - Impression:  Choledocholithiasis with 1.3 cm obstructing stone in the distal common bile  duct (series 5 image 44).  calcified  spleen, calfications in the liver, no masses - no mention of cirrhosis    Past Medical History:  Diagnosis Date  . Arthritis   . Atrial fibrillation ([place])   . Choledocholithiasis   . Depression   . Diverticulosis 2020  . Gastric bypass status for obesity   . PCOS (polycystic ovarian syndrome)   . Tricuspid regurgitation      Past Surgical History:  Procedure Laterality Date  . CHOLECYSTECTOMY     with ERCP  . COLONOSCOPY WITH PROPOFOL N/A 09/13/2018   Procedure: COLONOSCOPY WITH PROPOFOL;  Surgeon: Ladene Artist, MD;  Location: [facility] ENDOSCOPY;  Service: Endoscopy;  Laterality: N/A;  . COLONOSCOPY WITH PROPOFOL N/A 09/24/2018   Procedure: COLONOSCOPY WITH PROPOFOL;  Surgeon: Mauri Pole, MD;  Location: [facility] ENDOSCOPY;  Service: Endoscopy;  Laterality: N/A;  . ESOPHAGOGASTRODUODENOSCOPY (EGD) WITH PROPOFOL N/A 09/12/2018   Procedure: ESOPHAGOGASTRODUODENOSCOPY (EGD) WITH PROPOFOL;  Surgeon: Ladene Artist, MD;  Location: [facility] ENDOSCOPY;  Service: Endoscopy;  Laterality: N/A;  . POLYPECTOMY  09/13/2018   Procedure: POLYPECTOMY;  Surgeon: Ladene Artist, MD;  Location: [facility] ENDOSCOPY;  Service: Endoscopy;;  . REPLACEMENT TOTAL KNEE    . TONSILLECTOMY     Family History  Problem Relation Age of Onset  . COPD Mother        Did not know her history  . Throat cancer Father    Social History   Tobacco Use  . Smoking status: Never Smoker  . Smokeless tobacco: Never Used  Vaping Use  . Vaping Use: Never used  Substance Use Topics  . Alcohol use: Yes   Current Outpatient Medications  Medication Sig Dispense Refill  . acetaminophen (TYLENOL) 500 MG tablet Take 500 mg by mouth every 6 (six) hours as needed.    Marland Kitchen apixaban (ELIQUIS) 5 MG TABS tablet Take 1 tablet (5 mg total) by mouth 2 (two) times daily. 180 tablet 1  . cetirizine (ZYRTEC) 10 MG tablet Take 10 mg by mouth daily.    . digoxin (LANOXIN) 0.125 MG tablet Take 1 tablet (0.125 mg total) by mouth daily. 90 tablet 2   . ferrous sulfate 324 (65 Fe) MG TBEC Take 1 tablet (324 mg total) by mouth daily. 30 tablet 3  . hydrochloric acid, bulk, 10 % LIQD     . hydrOXYzine (VISTARIL) 25 MG/ML injection Inject into the muscle.    . metoprolol succinate (TOPROL-XL) 25 MG 24 hr tablet Take 0.5 tablets (12.5 mg total) by mouth daily. Take with or immediately following a meal. 45 tablet 3  . metoprolol tartrate (LOPRESSOR) 25 MG tablet Take 0.5 tablets (12.5 mg total) by mouth daily as needed. 30 tablet 6  . oxyCODONE (OXY IR/ROXICODONE) 5 MG immediate release tablet Take 5 mg by mouth 3 (three) times daily as needed.    . sertraline (ZOLOFT) 50 MG tablet Take 50 mg by mouth daily. (Patient not taking: Reported on 09/24/2020)     No current facility-administered medications for this visit.   Allergies  Allergen Reactions  . Sulfa Antibiotics Rash     Review of Systems:  All systems reviewed and negative except where noted in HPI.   Lab Results  Component Value Date   WBC 4.0 09/24/2020   HGB 6.2 Repeated and verified X2. (LL) 09/24/2020   HCT 19.5 Repeated and verified X2. (LL) 09/24/2020   MCV 70.4 (L) 09/24/2020   PLT 203.0 09/24/2020    CBC Latest Ref Rng & Units 09/24/2020 06/27/2020 06/26/2019  WBC 4.0 - 10.5 K/uL 4.0 11.1(H) 5.9  Hemoglobin 12.0 - 15.0 g/dL 6.2 Repeated and verified X2.(LL) 10.5(L) 12.0  Hematocrit 36.0 - 46.0 % 19.5 Repeated and verified X2.(LL) 32.1(L) 35.7  Platelets 150.0 - 400.0 K/uL 203.0 313.0 296    Lab Results  Component Value Date   IRON 16 (L) 09/13/2018   TIBC 367 09/13/2018   FERRITIN 22 09/13/2018   Lab Results  Component Value Date   ALT 7 09/24/2020   AST 14 09/24/2020   ALKPHOS 69 09/24/2020   BILITOT 0.8 09/24/2020   Lab Results  Component Value Date   INR 1.5 (H) 09/24/2020   INR 1.3 (H) 06/27/2020   INR 1.05 09/11/2018     Physical Exam: BP (!) 142/70 (BP Location: Left Arm, Patient Position: Sitting)   Pulse 91   Ht 5' 5"  (1.651 m)   Wt 164 lb  (74.4 kg)   SpO2 94%   BMI 27.29 kg/m  Constitutional: Pleasant,well-developed, female in no acute distress. Abdominal: Soft, nondistended, nontender.  There are no masses palpable. No hepatomegaly. Extremities: no edema Lymphadenopathy: No cervical adenopathy noted. Neurological: Alert and oriented to person place and time. Skin: Skin is warm and dry. No rashes noted. Psychiatric: Normal mood and affect. Behavior is normal.   ASSESSMENT AND PLAN: 75 year old female here for reassessment of the following:  Choledocholithiasis Reported cirrhosis Anemia  As above, patient presented initially in January with a routine consultation for incidentally noted choledocholithiasis on llumbar MRI.  She had a leukocytosis and a bilirubin of 5 at that time, she drove to [facility] and underwent intraoperative ERCP with cholecystectomy given [facility] does not have capability to do ERCP on gastric bypass patients.  Sounds like that procedure / operation went well, although reportedly was told she may have cirrhosis based on appearance of her liver at the time.  This is news to her, no known history of liver disease.  She does have an alcohol history of 2 to 3 glasses of wine per day but has not drank since last year.  Interestingly on imaging she has no evidence of cirrhosis, nor splenomegaly, no evidence of obvious portal hypertension, and her platelets are normal.  If she does have cirrhosis, she is compensated and does not appear to be causing any active issues.  We discussed what cirrhosis is, risks for decompensation and HCC.  Again I am not convinced she does have cirrhosis at this time but based on report it is possible.  I will repeat some baseline labs today to repeat her liver function, check INR, AFP, and screen for viral hepatitis.  Counseled her that the only way to determine if she has cirrhosis may be with a liver biopsy, although that may not really change her management at this point time.  We may  consider another ultrasound in 3 months or so to reassess her liver and perform [place] screening if she does have cirrhosis.  Otherwise she has a history of EGD and colonoscopy for anemia back in 2020.  She is supposed to be on oral iron for history of gastric bypass and  has not been taking that.  She reported feeling extremely fatigued today and we rechecked a CBC and her hemoglobin is 6.2, resulted after she left the office.  She emphatically denies any bleeding symptoms at all, currently taking anticoagulation.  Given her symptomatic anemia I am recommending she go to the hospital for blood transfusion.  Pending she tolerates this okay she may not necessarily need to be admitted and can consider outpatient evaluation.  I do think she will warrant IV iron moving forward.  After the patient left the office today her labs came back and I relayed the results to her.  She will go to the emergency room for blood transfusion.  We will follow up with her in the next few days to review the results of pending labs and discuss further plans to evaluate her anemia, if any.  Unclear if this is just from ongoing iron deficiency in light of her gastric bypass state given she has no overt bleeding.  Plan: - CBC, CMET, INR, AFP, hep B surface antigen, hep C antibody - results reviewed as above, patient referred to the ED for blood transfusion given symptomatic anemia - will coordinate IV Iron as outpatient, and consider further evaluation for this - abstain from alcohol - will repeat a RUQ [place] in 3 months - discussed cirrhosis as above, not convinced she has this, if she does is well-compensated. She declines liver biopsy at this time  Capitola Cellar, MD [facility]

## 2020-09-24 NOTE — Patient Instructions (Addendum)
If you are age 74 or older, your body mass index should be between 23-30. Your Body mass index is 27.29 kg/m. If this is out of the aforementioned range listed, please consider follow up with your Primary Care Provider.  If you are age 75 or younger, your body mass index should be between 19-25. Your Body mass index is 27.29 kg/m. If this is out of the aformentioned range listed, please consider follow up with your Primary Care Provider.    Please go to the lab in the basement of our building to have lab work done as you leave today. Hit "B" for basement when you get on the elevator.  When the doors open the lab is on your left.  We will call you with the results. Thank you.  Due to recent changes in healthcare laws, you may see the results of your imaging and laboratory studies on MyChart before your provider has had a chance to review them.  We understand that in some cases there may be results that are confusing or concerning to you. Not all laboratory results come back in the same time frame and the provider may be waiting for multiple results in order to interpret others.  Please give [place] 48 hours in order for your provider to thoroughly review all the results before contacting the office for clarification of your results.   Please purchase the following medications over the counter and take as directed: Oral iron: Take daily   Thank you for entrusting me with your care and for choosing [facility], Dr. Meredosia Cellar

## 2020-09-24 NOTE — ED Provider Notes (Signed)
[facility] EMERGENCY DEPARTMENT Provider Note   CSN: [id number] Arrival date & time: 09/24/20  1807     History Chief Complaint  Patient presents with  . Abnormal Lab    Hannah Collier is a 75 y.o. female.  HPI   Pt went to her GI doctor today for follow up on abnormal liver findings noted with her choledocholithiasis.  Pt had been feeling fatigued for months.  She was told her symptoms were likely related to her surgical procedure back in December. .  Pt has not noticed any blood in her stool or dark stools.  She had blood tests today and was called about her hemoglobin being low.  No CP.  No abnormal beeding.  She does have history of prior GI bleed and is on eliquis  Past Medical History:  Diagnosis Date  . Arthritis   . Atrial fibrillation ([place])   . Choledocholithiasis   . Depression   . Diverticulosis 2020  . Gastric bypass status for obesity   . PCOS (polycystic ovarian syndrome)   . Tricuspid regurgitation     Patient Active Problem List   Diagnosis Date Noted  . Gastrointestinal hemorrhage with melena 10/25/2018  . 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 Hematochezia   . Status post colonoscopy with polypectomy   . Proximal colon ulcer   . History of gastric bypass 09/21/2018  . Generalized anxiety disorder 09/21/2018  . Transaminitis 09/21/2018  . Benign neoplasm of ascending colon   . Benign neoplasm of cecum   . Iron deficiency anemia   . Symptomatic anemia   . Acute GI bleeding 09/11/2018  . New onset a-fib ([place]) 09/11/2018  . Psoriatic arthritis ([place]) 09/11/2018  . Acute blood loss anemia 09/11/2018    Past Surgical History:  Procedure Laterality Date  . CHOLECYSTECTOMY     with ERCP  . COLONOSCOPY WITH PROPOFOL N/A 09/13/2018   Procedure: COLONOSCOPY WITH PROPOFOL;  Surgeon: Ladene Artist, MD;  Location: [facility] ENDOSCOPY;  Service: Endoscopy;  Laterality: N/A;  . COLONOSCOPY WITH PROPOFOL N/A 09/24/2018    Procedure: COLONOSCOPY WITH PROPOFOL;  Surgeon: Mauri Pole, MD;  Location: [facility] ENDOSCOPY;  Service: Endoscopy;  Laterality: N/A;  . ESOPHAGOGASTRODUODENOSCOPY (EGD) WITH PROPOFOL N/A 09/12/2018   Procedure: ESOPHAGOGASTRODUODENOSCOPY (EGD) WITH PROPOFOL;  Surgeon: Ladene Artist, MD;  Location[facility] ENDOSCOPY;  Service: Endoscopy;  Laterality: N/A;  . POLYPECTOMY  09/13/2018   Procedure: POLYPECTOMY;  Surgeon: Ladene Artist, MD;  Location: [facility] ENDOSCOPY;  Service: Endoscopy;;  . REPLACEMENT TOTAL KNEE    . TONSILLECTOMY       OB History   No obstetric history on file.     Family History  Problem Relation Age of Onset  . COPD Mother        Did not know her history  . Throat cancer Father     Social History   Tobacco Use  . Smoking status: Never Smoker  . Smokeless tobacco: Never Used  Vaping Use  . Vaping Use: Never used  Substance Use Topics  . Alcohol use: Yes    Home Medications Prior to Admission medications   Medication Sig Start Date End Date Taking? Authorizing Provider  acetaminophen (TYLENOL) 500 MG tablet Take 500 mg by mouth every 6 (six) hours as needed.    [provider]  apixaban (ELIQUIS) 5 MG TABS tablet Take 1 tablet (5 mg total) by mouth 2 (two) times daily. 05/13/20   Minus Breeding, MD  cetirizine Alethia Berthold)  10 MG tablet Take 10 mg by mouth daily.    [provider]  digoxin (LANOXIN) 0.125 MG tablet Take 1 tablet (0.125 mg total) by mouth daily. 05/14/20   Minus Breeding, MD  ferrous sulfate 324 (65 Fe) MG TBEC Take 1 tablet (324 mg total) by mouth daily. 09/24/20   Armbruster, Carlota Raspberry, MD  hydrochloric acid, bulk, 10 % LIQD     [provider]  hydrOXYzine (VISTARIL) 25 MG/ML injection Inject into the muscle.    [provider]  metoprolol succinate (TOPROL-XL) 25 MG 24 hr tablet Take 0.5 tablets (12.5 mg total) by mouth daily. Take with or immediately following a meal. 09/13/20 12/12/20  Minus Breeding, MD   metoprolol tartrate (LOPRESSOR) 25 MG tablet Take 0.5 tablets (12.5 mg total) by mouth daily as needed. 05/14/20   Minus Breeding, MD  oxyCODONE (OXY IR/ROXICODONE) 5 MG immediate release tablet Take 5 mg by mouth 3 (three) times daily as needed. 09/06/20   [provider]  sertraline (ZOLOFT) 50 MG tablet Take 50 mg by mouth daily. Patient not taking: Reported on 09/24/2020 06/07/20   [provider]    Allergies    Sulfa antibiotics  Review of Systems   Review of Systems  All other systems reviewed and are negative.   Physical Exam Updated Vital Signs BP (!) 146/71   Pulse 60   Temp 97.6 F (36.4 C) (Oral)   Resp 14   SpO2 99%   Physical Exam Vitals and nursing note reviewed.  Constitutional:      Appearance: She is well-developed and well-nourished. She is not toxic-appearing or diaphoretic.  HENT:     Head: Normocephalic and atraumatic.     Right Ear: External ear normal.     Left Ear: External ear normal.  Eyes:     General: No scleral icterus.       Right eye: No discharge.        Left eye: No discharge.     Conjunctiva/sclera: Conjunctivae normal.  Neck:     Trachea: No tracheal deviation.  Cardiovascular:     Rate and Rhythm: Normal rate and regular rhythm.     Pulses: Intact distal pulses.  Pulmonary:     Effort: Pulmonary effort is normal. No respiratory distress.     Breath sounds: Normal breath sounds. No stridor. No wheezing or rales.  Abdominal:     General: Bowel sounds are normal. There is no distension.     Palpations: Abdomen is soft.     Tenderness: There is no abdominal tenderness. There is no guarding or rebound.  Genitourinary:    Comments: No gross blood on rectal exam Musculoskeletal:        General: No tenderness.     Cervical back: Neck supple.     Right lower leg: Edema present.     Left lower leg: Edema present.  Skin:    General: Skin is warm and dry.     Coloration: Skin is pale.     Findings: No rash.      Comments: Petechial lesions noted in the lower extremities, pitting edema  Neurological:     Mental Status: She is alert.     Cranial Nerves: No cranial nerve deficit (no facial droop, extraocular movements intact, no slurred speech).     Sensory: No sensory deficit.     Motor: No abnormal muscle tone or seizure activity.     Coordination: Coordination normal.     Deep Tendon Reflexes:  Strength normal.  Psychiatric:        Mood and Affect: Mood and affect normal.     ED Results / Procedures / Treatments   Labs (all labs ordered are listed, but only abnormal results are displayed) Labs Reviewed  CBC WITH DIFFERENTIAL/PLATELET - Abnormal; Notable for the following components:      Result Value   WBC 2.1 (*)    RBC 2.56 (*)    Hemoglobin 5.7 (*)    HCT 18.6 (*)    MCV 72.7 (*)    MCH 22.3 (*)    RDW 17.6 (*)    Lymphs Abs 0.3 (*)    Monocytes Absolute 0.0 (*)    All other components within normal limits  COMPREHENSIVE METABOLIC PANEL - Abnormal; Notable for the following components:   Sodium 130 (*)    Glucose, Bld 276 (*)    Creatinine, Ser 1.02 (*)    Albumin 3.4 (*)    GFR, Estimated 58 (*)    All other components within normal limits  RETICULOCYTES - Abnormal; Notable for the following components:   RBC. 2.81 (*)    Immature Retic Fract 18.5 (*)    All other components within normal limits  VITAMIN B12  FOLATE  IRON AND TIBC  FERRITIN  POC OCCULT BLOOD, ED  TYPE AND SCREEN  PREPARE RBC (CROSSMATCH)     Procedures .Critical Care Performed by: Dorie Rank, MD Authorized by: Dorie Rank, MD   Critical care provider statement:    Critical care time (minutes):  45   Critical care was time spent personally by me on the following activities:  Discussions with consultants, evaluation of patient's response to treatment, examination of patient, ordering and performing treatments and interventions, ordering and review of laboratory studies, ordering and review of  radiographic studies, pulse oximetry, re-evaluation of patient's condition, obtaining history from patient or surrogate and review of old charts     Medications Ordered in ED Medications  0.9 %  sodium chloride infusion (has no administration in time range)    ED Course  I have reviewed the triage vital signs and the nursing notes.  Pertinent labs & imaging results that were available during my care of the patient were reviewed by me and considered in my medical decision making (see chart for details).    MDM Rules/Calculators/A&P                          Patient presented to the ED for symptomatic anemia.  Patient does have significant anemia with a hemoglobin of 5.7 in the ED.  She does have a low MCV.  Hemoccult is negative.  No signs of active bleeding.  Patient however is on anticoagulation.  I have ordered 2 units of blood.  Anemia panel was sent off.  I will consult the medical service for admission and further treatment.  Final Clinical Impression(s) / ED Diagnoses Final diagnoses:  Symptomatic anemia      Dorie Rank, MD 09/24/20 2154

## 2020-09-24 NOTE — ED Triage Notes (Signed)
Patient with low Hgb of 6.2 from todays bloodwork with PCP.  Patient was seen today in office, s/p surgery in December.

## 2020-09-25 ENCOUNTER — Encounter (HOSPITAL_COMMUNITY): Payer: Self-pay | Admitting: Internal Medicine

## 2020-09-25 DIAGNOSIS — I480 Paroxysmal atrial fibrillation: Secondary | ICD-10-CM | POA: Diagnosis present

## 2020-09-25 DIAGNOSIS — D649 Anemia, unspecified: Secondary | ICD-10-CM | POA: Diagnosis not present

## 2020-09-25 LAB — HEPATITIS C ANTIBODY
Hepatitis C Ab: NONREACTIVE
SIGNAL TO CUT-OFF: 0.01 (ref <1.00)

## 2020-09-25 LAB — CBC
HCT: 23.5 % — ABNORMAL LOW (ref 36.0–46.0)
Hemoglobin: 7.7 g/dL — ABNORMAL LOW (ref 12.0–15.0)
MCH: 24.5 pg — ABNORMAL LOW (ref 26.0–34.0)
MCHC: 32.8 g/dL (ref 30.0–36.0)
MCV: 74.8 fL — ABNORMAL LOW (ref 80.0–100.0)
Platelets: 190 10*3/uL (ref 150–400)
RBC: 3.14 MIL/uL — ABNORMAL LOW (ref 3.87–5.11)
RDW: 19.7 % — ABNORMAL HIGH (ref 11.5–15.5)
WBC: 3.3 10*3/uL — ABNORMAL LOW (ref 4.0–10.5)
nRBC: 0 % (ref 0.0–0.2)

## 2020-09-25 LAB — BASIC METABOLIC PANEL
Anion gap: 7 (ref 5–15)
BUN: 12 mg/dL (ref 8–23)
CO2: 24 mmol/L (ref 22–32)
Calcium: 9.2 mg/dL (ref 8.9–10.3)
Chloride: 101 mmol/L (ref 98–111)
Creatinine, Ser: 0.94 mg/dL (ref 0.44–1.00)
GFR, Estimated: >60 mL/min (ref >60)
Glucose, Bld: 123 mg/dL — ABNORMAL HIGH (ref 70–99)
Potassium: 4.8 mmol/L (ref 3.5–5.1)
Sodium: 132 mmol/L — ABNORMAL LOW (ref 135–145)

## 2020-09-25 LAB — AFP TUMOR MARKER: AFP-Tumor Marker: 2.6 ng/mL

## 2020-09-25 LAB — HEMOGLOBIN A1C
Hgb A1c MFr Bld: 5.2 % (ref 4.8–5.6)
Mean Plasma Glucose: 102.54 mg/dL

## 2020-09-25 LAB — DIGOXIN LEVEL: Digoxin Level: 0.5 ng/mL — ABNORMAL LOW (ref 0.8–2.0)

## 2020-09-25 LAB — POC OCCULT BLOOD, ED: Fecal Occult Bld: NEGATIVE

## 2020-09-25 LAB — PREPARE RBC (CROSSMATCH)

## 2020-09-25 LAB — RESP PANEL BY RT-PCR (FLU A&B, COVID) ARPGX2
Influenza A by PCR: NEGATIVE
Influenza B by PCR: NEGATIVE
SARS Coronavirus 2 by RT PCR: NEGATIVE

## 2020-09-25 LAB — HEPATITIS B SURFACE ANTIGEN: Hepatitis B Surface Ag: NONREACTIVE

## 2020-09-25 MED ORDER — ACETAMINOPHEN 325 MG PO TABS: 650.0000 mg | ORAL_TABLET | Freq: Four times a day (QID) | ORAL | Status: DC | PRN

## 2020-09-25 MED ORDER — SERTRALINE HCL 50 MG PO TABS
50.0000 mg | ORAL_TABLET | Freq: Every day | ORAL | Status: DC
  Filled 2020-09-25 (×2): qty 1

## 2020-09-25 MED ORDER — METOPROLOL SUCCINATE ER 25 MG PO TB24
12.5000 mg | ORAL_TABLET | Freq: Every day | ORAL | Status: DC
  Administered 2020-09-25: 12.5 mg via ORAL
  Filled 2020-09-25: qty 1

## 2020-09-25 MED ORDER — LORATADINE 10 MG PO TABS
10.0000 mg | ORAL_TABLET | Freq: Every day | ORAL | Status: DC
  Administered 2020-09-25: 10 mg via ORAL
  Filled 2020-09-25: qty 1

## 2020-09-25 MED ORDER — ACETAMINOPHEN 650 MG RE SUPP: 650.0000 mg | Freq: Four times a day (QID) | RECTAL | Status: DC | PRN

## 2020-09-25 MED ORDER — SODIUM CHLORIDE 0.9% IV SOLUTION: Freq: Once | INTRAVENOUS | Status: AC

## 2020-09-25 MED ORDER — OXYCODONE HCL 5 MG PO TABS
5.0000 mg | ORAL_TABLET | Freq: Three times a day (TID) | ORAL | Status: DC | PRN
  Administered 2020-09-25: 5 mg via ORAL
  Filled 2020-09-25: qty 1

## 2020-09-25 MED ORDER — DIGOXIN 125 MCG PO TABS
0.1250 mg | ORAL_TABLET | Freq: Every day | ORAL | Status: DC
  Administered 2020-09-25: 0.125 mg via ORAL
  Filled 2020-09-25: qty 1

## 2020-09-25 MED ORDER — FERROUS SULFATE 325 (65 FE) MG PO TABS
324.0000 mg | ORAL_TABLET | Freq: Every day | ORAL | Status: DC
  Administered 2020-09-25: 324 mg via ORAL
  Filled 2020-09-25: qty 1

## 2020-09-25 MED ORDER — FUROSEMIDE 40 MG PO TABS: 40.0000 mg | ORAL_TABLET | Freq: Every day | ORAL | 0 refills | Status: AC | PRN

## 2020-09-25 NOTE — Discharge Summary (Addendum)
Discharge Summary  Hannah Collier ZJI:[id number] DOB: 01-Jul-1946  PCP: Gaynelle Arabian, MD  Admit date: 09/24/2020 Discharge date: 09/25/2020  Time spent:  28mins, case discussed with GI Dr Carlean Purl over the phone  Recommendations for Outpatient Follow-up:  1. F/u with PCP within a week  for hospital discharge follow up, repeat cbc/bmp at follow up 2. F/u with GI Dr Havery Moros 3. F/u with cardiology Dr Percival Spanish 4. Patient is advised to check blood pressure, daily weight  and monitor edema 5. New meds, prn lasix  Discharge Diagnoses:  Active Hospital Problems   Diagnosis Date Noted  . Symptomatic anemia   . PAF (paroxysmal atrial fibrillation) ([facility]) 09/25/2020  . History of gastric bypass 09/21/2018    Resolved Hospital Problems  No resolved problems to display.    Discharge Condition: stable  Diet recommendation: heart healthy  There were no vitals filed for this visit.  History of present illness: ( per admitting MD Dr Hal Hope) PCP: Gaynelle Arabian, MD  Patient coming from: Home.  Chief Complaint: Low hemoglobin.  HPI: Hannah Collier is a 75 y.o. female with history of gastric bypass with Roux-en-Y in 2005 followed by Dr. Havery Moros recently for possible liver cirrhosis was found to be anemic and was referred to the ER.  Patient had surgery at [facility] in December 2021 for cholecystectomy and ERCP during which patient was told she may have liver cirrhosis and had followed with Dr. Havery Moros.  Lab work showed hemoglobin of around 6 and was referred to the ER.  Patient denies noticing any obvious bleeding.  Patient is on apixaban for A. fib.  Patient has been in profound fatigue and weakness last few weeks.  Denies chest pain fever chills.  ED Course: In the ER repeat hemoglobin shows blood was negative iron studies show ferritin of 3 TIBC of 416.  2 units of PRBC transfusion has been ordered and admitted for symptomatic anemia.  Covid test is  negative.  Hospital Course:  Principal Problem:   Symptomatic anemia Active Problems:   History of gastric bypass   PAF (paroxysmal atrial fibrillation) (HCC)   Symptomatic anemia Hemoccult negative Hemoglobin 5.7 on presentation, increased to seven-point 2:07 PRBC transfusion Patient has history of gastric bypass surgery, report continue to take sublingual B12 but stopped taking iron supplement She just had new iron supplement prescription recently, she states she can start taking iron supplement She is feeling better and desires to go home, case discussed with GI Dr. Carlean Purl who advised discharge with outpatient GI follow-up  Bilateral lower extremity pitting edema -Report has been going on for 3 weeks, lungs clear, blood pressure on higher side -She takes apixaban for A. Fib, less likely DVT -Echocardiogram 2020 showed LVEF 50 to 55% and possible diastolic dysfunction -Prescribed Lasix 40 mg daily as needed for edema -Advised daily weight, follow-up with PCP and cardiology, need to check BMP since newly started on Lasix  Paroxysmal A. fib on metoprolol and digoxin, apixaban -She remains in A. fib in the hospital with bradycardia heart rate ranged from 50s to 60s, she denies chest pain, no dizziness no short of breath -Continue home medication -Follow-up with cardiology  CKDII Cr 1.02 on presentation, cr 0.94 at discharge Renal dosing meds Repeat bmp at hospital discharge follow up  Hyperglycemia, likely due to stress Blood glucose on presentation was 276, A1c 5.2 -A.m. 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 Carlean Purl  Discharge Exam: BP (!) 164/89 (BP Location: Right Arm)    Pulse 61   Temp 97.9 F (36.6 C) (Oral)   Resp 18   SpO2 100%   General: NAD Cardiovascular: IRRR Respiratory: CTABL Extremities: bilateral lower extremity pitting edema  Discharge Instructions You were cared for by a hospitalist during your hospital stay. If you have any questions about your discharge medications or the care you received while you were in the hospital after you are discharged, you can call the unit and asked to speak with the hospitalist on call if the hospitalist that took care of you is not available. Once you are discharged, your primary care physician will handle any further medical issues. Please note that NO REFILLS for any discharge medications will be authorized once you are discharged, as it is imperative that you return to your primary care physician (or establish a relationship with a primary care physician if you do not have one) for your aftercare needs so that they can reassess your need for medications and monitor your lab values.  Discharge Instructions    Diet - low sodium heart healthy   Complete by: As directed    Increase activity slowly   Complete by: As directed      Allergies as of 09/25/2020      Reactions   Sulfa Antibiotics Rash      Medication List    STOP taking these medications   metoprolol succinate 25 MG 24 hr tablet Commonly known as: TOPROL-XL     TAKE these medications   acetaminophen 500 MG tablet Commonly known as: TYLENOL Take 500 mg by mouth every 6 (six) hours as needed.   apixaban 5 MG Tabs tablet Commonly known as: Eliquis Take 1 tablet (5 mg total) by mouth 2 (two) times daily.   cetirizine 10 MG tablet Commonly known as: ZYRTEC Take 10 mg by mouth daily.   digoxin 0.125 MG tablet Commonly known as: LANOXIN Take 1 tablet (0.125 mg total) by mouth daily.   ferrous sulfate 324 (65 Fe) MG Tbec Take 1 tablet (324 mg total) by mouth daily.   furosemide 40 MG tablet Commonly known as: Lasix Take 1 tablet (40  mg total) by mouth daily as needed for fluid or edema.   hydrochloric acid (bulk) 10 % Liqd   hydrOXYzine 25 MG/ML injection Commonly known as: VISTARIL Inject into the muscle.   metoprolol tartrate 25 MG tablet Commonly known as: LOPRESSOR Take 0.5 tablets (12.5 mg total) by mouth daily as needed.   oxyCODONE 5 MG immediate release tablet Commonly known as: Oxy IR/ROXICODONE Take 5 mg by mouth 3 (three) times daily as needed.   sertraline 50 MG tablet Commonly known as: ZOLOFT Take 50 mg by mouth daily.      Allergies  Allergen Reactions  . Sulfa Antibiotics Rash    Follow-up Information    Gaynelle Arabian, MD Follow up in 1 week(s).   Specialty: Family Medicine Why: hospital discharge follow up, please monitor leg edema , weight yourself daily, check your blood pressure twice a day, bring in records for your pcp and cardiology to review. Contact information: [address] [place] [address] [phone]        Minus Breeding, MD .   Specialty: Cardiology  Contact information: [address] [place] [address] [phone]        Armbruster, Carlota Raspberry, MD Follow up.   Specialty: Gastroenterology Contact information: [place] [address] [phone]        follow up labs Follow up.   Why: please have your cbc repeated to monitor anemia, continue to take iron and b12 supplement. please have renal function BMP checked as you are started on lasix for edema                The results of significant diagnostics from this hospitalization (including imaging, microbiology, ancillary and laboratory) are listed below for reference.    Significant Diagnostic Studies: No results found.  Microbiology: Recent Results (from the past 240 hour(s))  Resp Panel by RT-PCR (Flu A&B, Covid) Nasopharyngeal Swab     Status: None   Collection Time: 09/24/20 11:09 PM   Specimen: Nasopharyngeal Swab;  Nasopharyngeal(NP) swabs in vial transport medium  Result Value Ref Range Status   SARS Coronavirus 2 by RT PCR NEGATIVE NEGATIVE Final    Comment: (NOTE) SARS-CoV-2 target nucleic acids are NOT DETECTED.  The SARS-CoV-2 RNA is generally detectable in upper respiratory specimens during the acute phase of infection. The lowest concentration of SARS-CoV-2 viral copies this assay can detect is 138 copies/mL. A negative result does not preclude SARS-Cov-2 infection and should not be used as the sole basis for treatment or other patient management decisions. A negative result may occur with  improper specimen collection/handling, submission of specimen other than nasopharyngeal swab, presence of viral mutation(s) within the areas targeted by this assay, and inadequate number of viral copies(<138 copies/mL). A negative result must be combined with clinical observations, patient history, and epidemiological information. The expected result is Negative.  Fact Sheet for Patients:  EntrepreneurPulse.com.au  Fact Sheet for Healthcare Providers:  IncredibleEmployment.be  This test is no t yet approved or cleared by the [place] FDA and  has been authorized for detection and/or diagnosis of SARS-CoV-2 by FDA under an Emergency Use Authorization (EUA). This EUA will remain  in effect (meaning this test can be used) for the duration of the COVID-19 declaration under Section 564(b)(1) of the Act, 21 U.S.C.section 360bbb-3(b)(1), unless the authorization is terminated  or revoked sooner.       Influenza A by PCR NEGATIVE NEGATIVE Final   Influenza B by PCR NEGATIVE NEGATIVE Final    Comment: (NOTE) The Xpert Xpress SARS-CoV-2/FLU/RSV plus assay is intended as an aid in the diagnosis of influenza from Nasopharyngeal swab specimens and should not be used as a sole basis for treatment. Nasal washings and aspirates are unacceptable for Xpert Xpress  SARS-CoV-2/FLU/RSV testing.  Fact Sheet for Patients: EntrepreneurPulse.com.au  Fact Sheet for Healthcare Providers: IncredibleEmployment.be  This test is not yet approved or cleared by the [place] FDA and has been authorized for detection and/or diagnosis of SARS-CoV-2 by FDA under an Emergency Use Authorization (EUA). This EUA will remain in effect (meaning this test can be used) for the duration of the COVID-19 declaration under Section 564(b)(1) of the Act, 21 U.S.C. section 360bbb-3(b)(1), unless the authorization is terminated or revoked.  Performed at [facility], [place] [address] [place], [place] [address]      Labs: Basic Metabolic Panel: Recent Labs  Lab 09/24/20 1545 09/24/20 1950 09/25/20 0541  NA 128* 130* 132*  K 4.2 4.6 4.8  CL 97 99 101  CO2 25 23 24  GLUCOSE 144* 276* 123*  BUN 11 10 12   CREATININE 0.87 1.02* 0.94  CALCIUM 9.3 9.0 9.2   Liver Function Tests: Recent Labs  Lab 09/24/20 1545 09/24/20 1950  AST 14 18  ALT 7 9  ALKPHOS 69 60  BILITOT 0.8 0.8  PROT 7.2 6.6  ALBUMIN 3.9 3.4*   No results for input(s): LIPASE, AMYLASE in the last 168 hours. No results for input(s): AMMONIA in the last 168 hours. CBC: Recent Labs  Lab 09/24/20 1545 09/24/20 1950 09/25/20 0541  WBC 4.0 2.1* 3.3*  NEUTROABS 3.6 1.9  --   HGB 6.2 Repeated and verified X2.* 5.7* 7.7*  HCT 19.5 Repeated and verified X2.* 18.6* 23.5*  MCV 70.4* 72.7* 74.8*  PLT 203.0 172 190   Cardiac Enzymes: No results for input(s): CKTOTAL, CKMB, CKMBINDEX, TROPONINI in the last 168 hours. BNP: BNP (last 3 results) No results for input(s): BNP in the last 8760 hours.  ProBNP (last 3 results) No results for input(s): PROBNP in the last 8760 hours.  CBG: No results for input(s): GLUCAP in the last 168 hours.     Signed:  Florencia Reasons MD, PhD, FACP  [facility] 09/25/2020, 10:56 AM

## 2020-09-25 NOTE — H&P (Addendum)
History and Physical    Hannah Collier PXT:[id number] DOB: May 05, 1946 DOA: 09/24/2020  PCP: Gaynelle Arabian, MD  Patient coming from: Home.  Chief Complaint: Low hemoglobin.  HPI: Hannah Collier is a 75 y.o. female with history of gastric bypass with Roux-en-Y in 2005 followed by Dr. Havery Moros recently for possible liver cirrhosis was found to be anemic and was referred to the ER.  Patient had surgery at [facility] in December 2021 for cholecystectomy and ERCP during which patient was told she may have liver cirrhosis and had followed with Dr. Havery Moros.  Lab work showed hemoglobin of around 6 and was referred to the ER.  Patient denies noticing any obvious bleeding.  Patient is on apixaban for A. fib.  Patient has been in profound fatigue and weakness last few weeks.  Denies chest pain fever chills.  ED Course: In the ER repeat hemoglobin shows blood was negative iron studies show ferritin of 3 TIBC of 416.  2 units of PRBC transfusion has been ordered and admitted for symptomatic anemia.  Covid test is negative.  Review of Systems: As per HPI, rest all negative.   Past Medical History:  Diagnosis Date  . Arthritis   . Atrial fibrillation ([place])   . Choledocholithiasis   . Depression   . Diverticulosis 2020  . Gastric bypass status for obesity   . PCOS (polycystic ovarian syndrome)   . Tricuspid regurgitation     Past Surgical History:  Procedure Laterality Date  . CHOLECYSTECTOMY     with ERCP  . COLONOSCOPY WITH PROPOFOL N/A 09/13/2018   Procedure: COLONOSCOPY WITH PROPOFOL;  Surgeon: Ladene Artist, MD;  Location: [facility] ENDOSCOPY;  Service: Endoscopy;  Laterality: N/A;  . COLONOSCOPY WITH PROPOFOL N/A 09/24/2018   Procedure: COLONOSCOPY WITH PROPOFOL;  Surgeon: Mauri Pole, MD;  Location: [facility] ENDOSCOPY;  Service: Endoscopy;  Laterality: N/A;  . ESOPHAGOGASTRODUODENOSCOPY (EGD) WITH PROPOFOL N/A 09/12/2018   Procedure: ESOPHAGOGASTRODUODENOSCOPY  (EGD) WITH PROPOFOL;  Surgeon: Ladene Artist, MD;  Location: [facility] ENDOSCOPY;  Service: Endoscopy;  Laterality: N/A;  . POLYPECTOMY  09/13/2018   Procedure: POLYPECTOMY;  Surgeon: Ladene Artist, MD;  Location: [facility] ENDOSCOPY;  Service: Endoscopy;;  . REPLACEMENT TOTAL KNEE    . TONSILLECTOMY       reports that she has never smoked. She has never used smokeless tobacco. She reports current alcohol use. No history on file for drug use.  Allergies  Allergen Reactions  . Sulfa Antibiotics Rash    Family History  Problem Relation Age of Onset  . COPD Mother        Did not know her history  . Throat cancer Father     Prior to Admission medications   Medication Sig Start Date End Date Taking? Authorizing Provider  acetaminophen (TYLENOL) 500 MG tablet Take 500 mg by mouth every 6 (six) hours as needed.    [provider]  apixaban (ELIQUIS) 5 MG TABS tablet Take 1 tablet (5 mg total) by mouth 2 (two) times daily. 05/13/20   Minus Breeding, MD  cetirizine (ZYRTEC) 10 MG tablet Take 10 mg by mouth daily.    [provider]  digoxin (LANOXIN) 0.125 MG tablet Take 1 tablet (0.125 mg total) by mouth daily. 05/14/20   Minus Breeding, MD  ferrous sulfate 324 (65 Fe) MG TBEC Take 1 tablet (324 mg total) by mouth daily. 09/24/20   Armbruster, Carlota Raspberry, MD  hydrochloric acid, bulk, 10 % LIQD  [provider]  hydrOXYzine (VISTARIL) 25 MG/ML injection Inject into the muscle.    [provider]  metoprolol succinate (TOPROL-XL) 25 MG 24 hr tablet Take 0.5 tablets (12.5 mg total) by mouth daily. Take with or immediately following a meal. 09/13/20 12/12/20  Minus Breeding, MD  metoprolol tartrate (LOPRESSOR) 25 MG tablet Take 0.5 tablets (12.5 mg total) by mouth daily as needed. 05/14/20   Minus Breeding, MD  oxyCODONE (OXY IR/ROXICODONE) 5 MG immediate release tablet Take 5 mg by mouth 3 (three) times daily as needed. 09/06/20   [provider]  sertraline  (ZOLOFT) 50 MG tablet Take 50 mg by mouth daily. Patient not taking: Reported on 09/24/2020 06/07/20   [provider]    Physical Exam: Constitutional: Moderately built and nourished. Vitals:   09/24/20 2330 09/24/20 2345 09/25/20 0015 09/25/20 0019  BP: (!) 145/83 (!) 149/90 115/73   Pulse: 69 72 75   Resp: (!) 22 (!) 24 19   Temp:    97.6 F (36.4 C)  TempSrc:    Oral  SpO2: 100% 100% 100%    Eyes: Anicteric no pallor. ENMT: No discharge from the ears eyes nose and mouth. Neck: No mass felt.  No neck rigidity. Respiratory: No rhonchi or crepitations. Cardiovascular: S1-S2 heard. Abdomen: Soft nontender bowel sounds present. Musculoskeletal: No edema. Skin: No rash. Neurologic: Alert awake oriented to time place and person.  Moves all extremities. Psychiatric: Appears normal.  Normal affect.   Labs on Admission: I have personally reviewed following labs and imaging studies  CBC: Recent Labs  Lab 09/24/20 1545 09/24/20 1950  WBC 4.0 2.1*  NEUTROABS 3.6 1.9  HGB 6.2 Repeated and verified X2.* 5.7*  HCT 19.5 Repeated and verified X2.* 18.6*  MCV 70.4* 72.7*  PLT 203.0 619   Basic Metabolic Panel: Recent Labs  Lab 09/24/20 1545 09/24/20 1950  NA 128* 130*  K 4.2 4.6  CL 97 99  CO2 25 23  GLUCOSE 144* 276*  BUN 11 10  CREATININE 0.87 1.02*  CALCIUM 9.3 9.0   GFR: Estimated Creatinine Clearance: 48.9 mL/min (A) (by C-G formula based on SCr of 1.02 mg/dL (H)). Liver Function Tests: Recent Labs  Lab 09/24/20 1545 09/24/20 1950  AST 14 18  ALT 7 9  ALKPHOS 69 60  BILITOT 0.8 0.8  PROT 7.2 6.6  ALBUMIN 3.9 3.4*   No results for input(s): LIPASE, AMYLASE in the last 168 hours. No results for input(s): AMMONIA in the last 168 hours. Coagulation Profile: Recent Labs  Lab 09/24/20 1545  INR 1.5*   Cardiac Enzymes: No results for input(s): CKTOTAL, CKMB, CKMBINDEX, TROPONINI in the last 168 hours. BNP (last 3 results) No results for input(s):  PROBNP in the last 8760 hours. HbA1C: No results for input(s): HGBA1C in the last 72 hours. CBG: No results for input(s): GLUCAP in the last 168 hours. Lipid Profile: No results for input(s): CHOL, HDL, LDLCALC, TRIG, CHOLHDL, LDLDIRECT in the last 72 hours. Thyroid Function Tests: No results for input(s): TSH, T4TOTAL, FREET4, T3FREE, THYROIDAB in the last 72 hours. Anemia Panel: Recent Labs    09/24/20 2121  VITAMINB12 346  FOLATE 8.3  FERRITIN 4*  TIBC 416  IRON 11*  RETICCTPCT 1.2   Urine analysis: No results found for: COLORURINE, APPEARANCEUR, LABSPEC, PHURINE, GLUCOSEU, HGBUR, BILIRUBINUR, KETONESUR, PROTEINUR, UROBILINOGEN, NITRITE, LEUKOCYTESUR Sepsis Labs: @LABRCNTIP (procalcitonin:4,lacticidven:4) ) Recent Results (from the past 240 hour(s))  Resp Panel by RT-PCR (Flu A&B, Covid) Nasopharyngeal Swab  Status: None   Collection Time: 09/24/20 11:09 PM   Specimen: Nasopharyngeal Swab; Nasopharyngeal(NP) swabs in vial transport medium  Result Value Ref Range Status   SARS Coronavirus 2 by RT PCR NEGATIVE NEGATIVE Final    Comment: (NOTE) SARS-CoV-2 target nucleic acids are NOT DETECTED.  The SARS-CoV-2 RNA is generally detectable in upper respiratory specimens during the acute phase of infection. The lowest concentration of SARS-CoV-2 viral copies this assay can detect is 138 copies/mL. A negative result does not preclude SARS-Cov-2 infection and should not be used as the sole basis for treatment or other patient management decisions. A negative result may occur with  improper specimen collection/handling, submission of specimen other than nasopharyngeal swab, presence of viral mutation(s) within the areas targeted by this assay, and inadequate number of viral copies(<138 copies/mL). A negative result must be combined with clinical observations, patient history, and epidemiological information. The expected result is Negative.  Fact Sheet for Patients:   EntrepreneurPulse.com.au  Fact Sheet for Healthcare Providers:  IncredibleEmployment.be  This test is no t yet approved or cleared by the [place] FDA and  has been authorized for detection and/or diagnosis of SARS-CoV-2 by FDA under an Emergency Use Authorization (EUA). This EUA will remain  in effect (meaning this test can be used) for the duration of the COVID-19 declaration under Section 564(b)(1) of the Act, 21 U.S.C.section 360bbb-3(b)(1), unless the authorization is terminated  or revoked sooner.       Influenza A by PCR NEGATIVE NEGATIVE Final   Influenza B by PCR NEGATIVE NEGATIVE Final    Comment: (NOTE) The Xpert Xpress SARS-CoV-2/FLU/RSV plus assay is intended as an aid in the diagnosis of influenza from Nasopharyngeal swab specimens and should not be used as a sole basis for treatment. Nasal washings and aspirates are unacceptable for Xpert Xpress SARS-CoV-2/FLU/RSV testing.  Fact Sheet for Patients: EntrepreneurPulse.com.au  Fact Sheet for Healthcare Providers: IncredibleEmployment.be  This test is not yet approved or cleared by the [place] FDA and has been authorized for detection and/or diagnosis of SARS-CoV-2 by FDA under an Emergency Use Authorization (EUA). This EUA will remain in effect (meaning this test can be used) for the duration of the COVID-19 declaration under Section 564(b)(1) of the Act, 21 U.S.C. section 360bbb-3(b)(1), unless the authorization is terminated or revoked.  Performed at [facility], [place] [address], [place], [place] [address]      Radiological Exams on Admission: No results found.  Assessment/Plan Principal Problem:   Symptomatic anemia Active Problems:   History of gastric bypass   PAF (paroxysmal atrial fibrillation) (HCC)    1. Symptomatic anemia -stool for occult blood is negative.  Patient has iron deficiency anemia.  2 units  of PRBC transfusion has been ordered.  Will notify Dr. Havery Moros gastroenterologist for further work-up. 2. Paroxysmal atrial fibrillation on metoprolol and digoxin.  Check digoxin levels.  Holding apixaban for now in anticipation of possible procedure.  Patient has been off apixaban for the last 36 hours for spinal injection yesterday. 3. Acute renal failure could be from dehydration follow metabolic panel after transfusion. 4. Hyponatremia follow metabolic panel.  Could be from dehydration. 5. Hyperglycemia -check hemoglobin A1c.   DVT prophylaxis: SCDs for now. Code Status: Full code. Family Communication: Discussed with patient. Disposition Plan: Home. Consults called: None. Admission status: Observation.   Rise Patience MD [facility] Pager [phone].  If 7PM-7AM, please contact night-coverage [email or web address] Password [email or web address]  09/25/2020, 1:05 AM

## 2020-09-25 NOTE — ED Notes (Signed)
Lunch Ordered @ 1000. °

## 2020-09-25 NOTE — ED Notes (Signed)
Tele  Breakfast Ordered 

## 2020-09-25 NOTE — ED Notes (Signed)
Per Dr. Hal Hope hold pt in the ER until repeat labs come back

## 2020-09-26 ENCOUNTER — Telehealth: Payer: Self-pay

## 2020-09-26 DIAGNOSIS — D649 Anemia, unspecified: Secondary | ICD-10-CM

## 2020-09-26 LAB — TYPE AND SCREEN
ABO/RH(D): A POS
Antibody Screen: NEGATIVE
Unit division: 0
Unit division: 0

## 2020-09-26 LAB — BPAM RBC
Blood Product Expiration Date: 202203242359
Blood Product Expiration Date: 202203242359
ISSUE DATE / TIME: 202203012145
ISSUE DATE / TIME: 202203020050
Unit Type and Rh: 6200
Unit Type and Rh: 6200

## 2020-09-26 NOTE — Telephone Encounter (Signed)
Lm on vm for patient to return call. Lab order and reminder in epic.  

## 2020-09-26 NOTE — Telephone Encounter (Signed)
-----   Message from Yetta Flock, MD sent at 09/25/2020  4:14 PM EST ----- Herbert Seta this patient needs a Hgb drawn likely on Monday after she is discharged later today or tomorrow. Can you touch base with her tomorrow to coordinate? Thanks

## 2020-09-30 NOTE — Telephone Encounter (Signed)
Lm on vm for patient to return call 

## 2020-10-01 ENCOUNTER — Ambulatory Visit: Payer: Medicare Other | Admitting: Gastroenterology

## 2020-10-01 NOTE — Telephone Encounter (Signed)
Called patient's husband, I was able to speak with the patient and advised that she is due for repeat lab work at this time. Advised that she try to go to the lab tomorrow between 7:30 AM - 5 PM, she was unable to make her follow up appointment today due to other issues. Patient states that she will try her best to get by there. She states that she has been having some issues with her phone because she has not received any of the voicemail's that have been left for her. Patient verbalized understanding and had no concerns at the end of the call.

## 2020-10-02 ENCOUNTER — Other Ambulatory Visit (INDEPENDENT_AMBULATORY_CARE_PROVIDER_SITE_OTHER): Payer: Medicare Other

## 2020-10-02 ENCOUNTER — Telehealth: Payer: Self-pay

## 2020-10-02 DIAGNOSIS — D649 Anemia, unspecified: Secondary | ICD-10-CM | POA: Diagnosis not present

## 2020-10-02 LAB — HEMOGLOBIN: Hemoglobin: 7.9 g/dL — CL (ref 12.0–15.0)

## 2020-10-02 NOTE — Telephone Encounter (Signed)
Received critical lab value of Hgb 7.9.

## 2020-10-03 NOTE — Telephone Encounter (Signed)
Spoke with patient in regards to her lab results and recommendations per Dr. Havery Moros. Patient states that she has been taking Ferrous sulfate 325 mg, she has been advised to increase this to twice a day. Patient is aware that we will repeat lab work in 1 week. Patient's appt has been rescheduled to the next available appt on Friday, 10/25/20 at 9:50 AM, she is aware that she can call back periodically to see if there have been any cancellations for a sooner appt. Patient verbalized understanding of all information and had no concerns at the end of the call.   Lab order and reminder in epic.

## 2020-10-03 NOTE — Addendum Note (Signed)
Addended by: Yevette Edwards on: 10/03/2020 08:17 AM   Modules accepted: Orders

## 2020-10-03 NOTE — Telephone Encounter (Signed)
I was off yesterday afternoon and just seeing this message now.  Hgb is actually higher than when discharged from the hospital recently.  She should be on 325mg  ferrous sulfate, can you make sure she is taking this twice daily if she is not already doing so. Would repeat Hgb in 1 week again to make sure stable and appropriate uptrend.  [place] I think she was scheduled to see me this week for follow up but could not make it. I see she has a follow up scheduled in April, not sure if any room to get her in sooner. Thank you

## 2020-10-07 DIAGNOSIS — R609 Edema, unspecified: Secondary | ICD-10-CM | POA: Diagnosis not present

## 2020-10-07 DIAGNOSIS — I4819 Other persistent atrial fibrillation: Secondary | ICD-10-CM | POA: Diagnosis not present

## 2020-10-07 DIAGNOSIS — D649 Anemia, unspecified: Secondary | ICD-10-CM | POA: Diagnosis not present

## 2020-10-07 DIAGNOSIS — E871 Hypo-osmolality and hyponatremia: Secondary | ICD-10-CM | POA: Diagnosis not present

## 2020-10-09 ENCOUNTER — Telehealth: Payer: Self-pay

## 2020-10-09 ENCOUNTER — Other Ambulatory Visit: Payer: Self-pay

## 2020-10-09 ENCOUNTER — Ambulatory Visit (HOSPITAL_COMMUNITY): Payer: Medicare Other | Attending: Cardiology

## 2020-10-09 ENCOUNTER — Other Ambulatory Visit (INDEPENDENT_AMBULATORY_CARE_PROVIDER_SITE_OTHER): Payer: Medicare Other

## 2020-10-09 DIAGNOSIS — K8050 Calculus of bile duct without cholangitis or cholecystitis without obstruction: Secondary | ICD-10-CM | POA: Diagnosis not present

## 2020-10-09 DIAGNOSIS — I272 Pulmonary hypertension, unspecified: Secondary | ICD-10-CM | POA: Diagnosis not present

## 2020-10-09 DIAGNOSIS — D649 Anemia, unspecified: Secondary | ICD-10-CM | POA: Diagnosis not present

## 2020-10-09 DIAGNOSIS — R932 Abnormal findings on diagnostic imaging of liver and biliary tract: Secondary | ICD-10-CM | POA: Diagnosis not present

## 2020-10-09 LAB — ECHOCARDIOGRAM COMPLETE
AR max vel: 2.23 cm2
AV Area VTI: 2.18 cm2
AV Area mean vel: 2.12 cm2
AV Mean grad: 6 mmHg
AV Peak grad: 12.1 mmHg
Ao pk vel: 1.74 m/s
Area-P 1/2: 3.94 cm2
MV M vel: 5.52 m/s
MV Peak grad: 121.9 mmHg
MV VTI: 1.78 cm2
S' Lateral: 2.7 cm

## 2020-10-09 LAB — BASIC METABOLIC PANEL
BUN: 12 mg/dL (ref 6–23)
CO2: 26 mEq/L (ref 19–32)
Calcium: 9.3 mg/dL (ref 8.4–10.5)
Chloride: 100 mEq/L (ref 96–112)
Creatinine, Ser: 0.76 mg/dL (ref 0.40–1.20)
GFR: 77.35 mL/min (ref >60.00)
Glucose, Bld: 92 mg/dL (ref 70–99)
Potassium: 3.6 mEq/L (ref 3.5–5.1)
Sodium: 135 mEq/L (ref 135–145)

## 2020-10-09 LAB — HEMOGLOBIN: Hemoglobin: 8.1 g/dL — ABNORMAL LOW (ref 12.0–15.0)

## 2020-10-09 NOTE — Progress Notes (Signed)
BMET ordered. Left detailed message for patient letting her know that the lab order has been added and to give [place] a call back if she has any questions.

## 2020-10-09 NOTE — Telephone Encounter (Signed)
-----   Message from Yevette Edwards, RN sent at 10/03/2020  8:17 AM EST ----- Regarding: Lab Repeat Hgb, order in epic.

## 2020-10-09 NOTE — Telephone Encounter (Signed)
Spoke with patient to remind her that she is due for repeat labs at this time. She is aware that no appointment is necessary and she can stop by at her convenience between 7:30 AM - 5 PM. Patient states that she recently saw her PCP and they wanted to know if we could recheck her sodium level as well so that she won't have to have labs drawn multiple times. Okay to order BMET? Patient states that she will come in later today for labs. Please advise, thank you.

## 2020-10-09 NOTE — Telephone Encounter (Signed)
BMET ordered. Left detailed message for patient letting her know that the lab order has been added and to give [place] a call back if she has any questions.

## 2020-10-09 NOTE — Telephone Encounter (Signed)
Yes that is fine, can add BMET. Thanks

## 2020-10-10 ENCOUNTER — Other Ambulatory Visit: Payer: Self-pay

## 2020-10-10 DIAGNOSIS — D649 Anemia, unspecified: Secondary | ICD-10-CM

## 2020-10-10 NOTE — Progress Notes (Signed)
Dr. Havery Moros would like a repeat Hgb in 10 days

## 2020-10-14 DIAGNOSIS — D649 Anemia, unspecified: Secondary | ICD-10-CM | POA: Diagnosis not present

## 2020-10-15 DIAGNOSIS — Z79891 Long term (current) use of opiate analgesic: Secondary | ICD-10-CM | POA: Diagnosis not present

## 2020-10-15 DIAGNOSIS — M5416 Radiculopathy, lumbar region: Secondary | ICD-10-CM | POA: Diagnosis not present

## 2020-10-21 ENCOUNTER — Other Ambulatory Visit (INDEPENDENT_AMBULATORY_CARE_PROVIDER_SITE_OTHER): Payer: Medicare Other

## 2020-10-21 DIAGNOSIS — D649 Anemia, unspecified: Secondary | ICD-10-CM | POA: Diagnosis not present

## 2020-10-21 LAB — HEMOGLOBIN: Hemoglobin: 9.1 g/dL — ABNORMAL LOW (ref 12.0–15.0)

## 2020-10-22 ENCOUNTER — Other Ambulatory Visit: Payer: Self-pay

## 2020-10-22 DIAGNOSIS — D649 Anemia, unspecified: Secondary | ICD-10-CM

## 2020-10-22 NOTE — Progress Notes (Signed)
Patient due for cbc in 3 weeks for Dr. Havery Moros

## 2020-10-25 ENCOUNTER — Encounter: Payer: Self-pay | Admitting: Gastroenterology

## 2020-10-25 ENCOUNTER — Ambulatory Visit (INDEPENDENT_AMBULATORY_CARE_PROVIDER_SITE_OTHER): Payer: Medicare Other | Admitting: Gastroenterology

## 2020-10-25 VITALS — BP 130/80 | HR 78 | Ht 65.0 in | Wt 155.0 lb

## 2020-10-25 DIAGNOSIS — R932 Abnormal findings on diagnostic imaging of liver and biliary tract: Secondary | ICD-10-CM

## 2020-10-25 DIAGNOSIS — D509 Iron deficiency anemia, unspecified: Secondary | ICD-10-CM

## 2020-10-25 DIAGNOSIS — Z9884 Bariatric surgery status: Secondary | ICD-10-CM

## 2020-10-25 NOTE — Patient Instructions (Addendum)
If you are age 74 or older, your body mass index should be between 23-30. Your Body mass index is 25.79 kg/m. If this is out of the aforementioned range listed, please consider follow up with your Primary Care Provider.  If you are age 20 or younger, your body mass index should be between 19-25. Your Body mass index is 25.79 kg/m. If this is out of the aformentioned range listed, please consider follow up with your Primary Care Provider.   Please go to the lab in 3 weeks for hemoglobin.  Continue iron twice a day.  You will be due for an ultrasound in June but understand that you are not ready to schedule today due to some upcoming plans.  We will call you in May to get you scheduled.  Thank you for entrusting me with your care and for choosing [facility], Dr. Henry Cellar

## 2020-10-25 NOTE — Progress Notes (Signed)
HPI :  75 year old female here for follow-up visit.  She has a history of remote Roux-en-Y, history of alcohol use, A. fib on Eliquis, history of iron deficiency anemia and GI bleeding in 2020, history of choledocholithiasis.   Recall that she was seen in our office on December 2 by Tye Savoy PA.  At that point in time she was followed by neurosurgery for lower back pain.  She had an MRI of the spine in September 2021 which showed a CBD of 27 mm as well as choledocholithiasis.  At that point in time her bilirubin was 5.6, alk phos 449, AST 55, ALT 29.  She had a white blood cell count of 11.1.  She went to [facility] that day and presented to the emergency department.  She was admitted to the hospital and underwent a combined intraoperative ERCP with cholecystectomy.  During that procedure she was reported to have " intraoperative findings suggestive of cirrhosis", although no liver biopsy was performed.  On imaging studies she has not had any evidence of cirrhosis, her spleen is normal.  She is unaware of any prior history of cirrhosis.  She has drank 2 to 3 glasses of wine per day for 6 to 7 years but has not had any alcohol since last June.  She states back in 2011 during her surgery she had a exposure to viral hepatitis and was tested at that time was negative.  I saw her in the office on March 1 for follow-up after that hospitalization.  She reported feeling extremely fatigued at that visit.  She continues to take Eliquis for A. fib.  She did endorse passing brown stools and having no overt blood loss.  Routine labs show her hemoglobin was 5.7.  She was mated to the hospital for blood transfusion and think she got a dose of IV iron, as her iron indices were low as well. She was admitted in February 2020 for anemia and dark stools, she underwent an EGD and colonoscopy at that time which did not show any significant pathology other than multiple right-sided polyps, the largest was 14 mm adenoma.  She  unfortunately had a post polypectomy bleed that required endoscopic intervention shortly thereafter.  She did not undergo any endoscopic evaluation when readmitted to the hospital.  She has since been discharged from the hospital and continues to feel much better.  Her hemoglobin has risen to 9.1, she is taking oral iron, ferrous sulfate 325 mg twice daily.  She denies any blood in her stool, her stools are dark green.  She has had stools negative for occult blood.  She states she had remotely been on iron for a long time but had been off it for the past 2 years leading up to her hospitalization.  She has a good appetite, no abdominal pain.  She denies any NSAID use.  She inquires about her liver and risks of cirrhosis, we had planned on follow-up imaging over time to reassess that at the last visit.   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 - Gastric bypass with a normal-sized pouch and intact staple line. Gastrojejunal anastomosis characterized by healthy appearing mucosa and visible sutures. - Small hiatal hernia. - Normal examined jejunum. - No specimens collected.  Feb 2020 colonoscopy for melena and negative EGD -Three 7 to 14 mm polyps in the ascending colon and in the cecum, removed with a hot snare. Resected and retrieved. - Moderate  diverticulosis in the left colon. There was narrowing of the colon in association with the diverticular opening. There was evidence of diverticular spasm. There was no evidence of diverticular bleeding. - The examination was otherwise normal on direct and retroflexion views.  Colon, polyp(s), cecum and ascending - MULTIPLE FRAGMENTS OF TUBULAR ADENOMA. - SESSILE SERRATED POLYP (X3 FRAGMENTS), INDEFINITE FOR DYSPLASIA, SEE COMMENT.  [place] in Feb 2020 remarkable for choleithiaiis and CBD of 8 mm.  9/26/21lumbar MRI  IMPRESSION: 1. Choledocholithiasis with  marked bile duct dilatation that has progressed from a 2020 ultrasound. 2. 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 3. Moderate spinal stenosis at L1-2 and L3-4. 4. Moderate foraminal narrowing on both sides at L3-4 and on the left at L5-S1  On 07/01/20 - Patient was taken to the operating room on 07/01/2020 by Wray Kearns MD and Obie Dredge MD who performed a laparoscopic assisted ERCP and cholecystectomy . Intraop findings notable for cirrhosis  CT scan 06/27/20 - Impression:  Choledocholithiasis with 1.3 cm obstructing stone in the distal common bile  duct (series 5 image 44). calcified spleen, calfications in the liver, no masses - no mention of cirrhosis  FOBT negative on 3/1 FOBT negative again x 3 on 10/14/20  Echo 10/09/20 - EF 60-65%   Past Medical History:  Diagnosis Date  . Anemia   . Arthritis   . Atrial fibrillation ([place])   . Choledocholithiasis   . Depression   . Diverticulosis 2020  . Gastric bypass status for obesity   . Iron deficiency anemia   . PCOS (polycystic ovarian syndrome)   . Tricuspid regurgitation      Past Surgical History:  Procedure Laterality Date  . CHOLECYSTECTOMY     with ERCP  . COLONOSCOPY WITH PROPOFOL N/A 09/13/2018   Procedure: COLONOSCOPY WITH PROPOFOL;  Surgeon: Ladene Artist, MD;  Location: [facility] ENDOSCOPY;  Service: Endoscopy;  Laterality: N/A;  . COLONOSCOPY WITH PROPOFOL N/A 09/24/2018   Procedure: COLONOSCOPY WITH PROPOFOL;  Surgeon: Mauri Pole, MD;  Location: [facility] ENDOSCOPY;  Service: Endoscopy;  Laterality: N/A;  . ESOPHAGOGASTRODUODENOSCOPY (EGD) WITH PROPOFOL N/A 09/12/2018   Procedure: ESOPHAGOGASTRODUODENOSCOPY (EGD) WITH PROPOFOL;  Surgeon: Ladene Artist, MD;  Location: [facility] ENDOSCOPY;  Service: Endoscopy;  Laterality: N/A;  . POLYPECTOMY  09/13/2018   Procedure: POLYPECTOMY;  Surgeon: Ladene Artist, MD;  Location: [facility] ENDOSCOPY;  Service: Endoscopy;;  . REPLACEMENT TOTAL KNEE    .  TONSILLECTOMY     Family History  Problem Relation Age of Onset  . COPD Mother        Did not know her history  . Throat cancer Father    Social History   Tobacco Use  . Smoking status: Never Smoker  . Smokeless tobacco: Never Used  Vaping Use  . Vaping Use: Never used  Substance Use Topics  . Alcohol use: Yes   Current Outpatient Medications  Medication Sig Dispense Refill  . acetaminophen (TYLENOL) 500 MG tablet Take 500 mg by mouth 2 (two) times daily.    Marland Kitchen apixaban (ELIQUIS) 5 MG TABS tablet Take 1 tablet (5 mg total) by mouth 2 (two) times daily. 180 tablet 1  . cetirizine (ZYRTEC) 10 MG tablet Take 10 mg by mouth daily.    . digoxin (LANOXIN) 0.125 MG tablet Take 1 tablet (0.125 mg total) by mouth daily. 90 tablet 2  . ferrous sulfate 324 (65 Fe) MG TBEC Take 1 tablet (324 mg total) by mouth daily. (Patient taking differently: Take 324  mg by mouth in the morning and at bedtime.) 30 tablet 3  . furosemide (LASIX) 40 MG tablet Take 1 tablet (40 mg total) by mouth daily as needed for fluid or edema. 30 tablet 0  . hydrOXYzine (VISTARIL) 25 MG/ML injection Inject into the muscle.    . metoprolol tartrate (LOPRESSOR) 25 MG tablet Take 0.5 tablets (12.5 mg total) by mouth daily as needed. 30 tablet 6  . oxyCODONE (OXY IR/ROXICODONE) 5 MG immediate release tablet Take 5 mg by mouth 3 (three) times daily as needed.     No current facility-administered medications for this visit.   Allergies  Allergen Reactions  . Sulfa Antibiotics Rash     Review of Systems: All systems reviewed and negative except where noted in HPI.    ECHOCARDIOGRAM COMPLETE  Result Date: 10/09/2020    ECHOCARDIOGRAM REPORT   Patient Name:   Hannah Collier Date of Exam: 10/09/2020 Medical Rec #:  [id number]       Height:       65.0 in Accession #:    [id number]      Weight:       164.0 lb Date of Birth:  11-Jun-1946       BSA:          1.818 m Patient Age:    3 years        BP:           123/63 mmHg  Patient Gender: F               HR:           77 bpm. Exam Location:  [address] Procedure: 2D Echo, Cardiac Doppler and Color Doppler Indications:    I27.20 Pulmonary Hypertension  History:        Patient has prior history of Echocardiogram examinations, most                 recent 09/13/2018. Abnormal ECG, Pulmonary HTN, Arrythmias:Atrial                 Fibrillation; Signs/Symptoms:Dyspnea and Murmur. Edema.  Sonographer:    Deliah Boston RDCS Referring Phys: [place]  1. Left ventricular ejection fraction, by estimation, is 60 to 65%. The left ventricle has normal function. The left ventricle has no regional wall motion abnormalities. There is mild asymmetric left ventricular hypertrophy of the basal-septal segment. Left ventricular diastolic parameters are indeterminate.  2. Right ventricular systolic function is normal. The right ventricular size is mildly enlarged. There is moderately elevated pulmonary artery systolic pressure. The estimated right ventricular systolic pressure is 33.4 mmHg.  3. Left atrial size was moderately dilated.  4. Right atrial size was severely dilated.  5. The mitral valve is normal in structure. Trivial mitral valve regurgitation. No evidence of mitral stenosis.  6. The tricuspid valve is abnormal. Tricuspid valve regurgitation is moderate.  7. The aortic valve is tricuspid. Aortic valve regurgitation is trivial. No aortic stenosis is present.  8. Aortic dilatation noted. There is dilatation of the ascending aorta, measuring 42 mm.  9. The inferior vena cava is normal in size with <50% respiratory variability, suggesting right atrial pressure of 8 mmHg. FINDINGS  Left Ventricle: Left ventricular ejection fraction, by estimation, is 60 to 65%. The left ventricle has normal function. The left ventricle has no regional wall motion abnormalities. The left ventricular internal cavity size was normal in size. There is  mild asymmetric left ventricular  hypertrophy of the basal-septal  segment. Left ventricular diastolic parameters are indeterminate. Right Ventricle: The right ventricular size is mildly enlarged. No increase in right ventricular wall thickness. Right ventricular systolic function is normal. There is moderately elevated pulmonary artery systolic pressure. The tricuspid regurgitant velocity is 3.24 m/s, and with an assumed right atrial pressure of 8 mmHg, the estimated right ventricular systolic pressure is 10.6 mmHg. Left Atrium: Left atrial size was moderately dilated. Right Atrium: Right atrial size was severely dilated. Pericardium: There is no evidence of pericardial effusion. Mitral Valve: The mitral valve is normal in structure. Trivial mitral valve regurgitation. No evidence of mitral valve stenosis. MV peak gradient, 12.8 mmHg. The mean mitral valve gradient is 2.0 mmHg. Tricuspid Valve: The tricuspid valve is abnormal. Tricuspid valve regurgitation is moderate. Aortic Valve: The aortic valve is tricuspid. Aortic valve regurgitation is trivial. No aortic stenosis is present. Aortic valve mean gradient measures 6.0 mmHg. Aortic valve peak gradient measures 12.1 mmHg. Aortic valve area, by VTI measures 2.18 cm. Pulmonic Valve: The pulmonic valve was not well visualized. Pulmonic valve regurgitation is not visualized. Aorta: The aortic root is normal in size and structure and aortic dilatation noted. There is dilatation of the ascending aorta, measuring 42 mm. Venous: The inferior vena cava is normal in size with less than 50% respiratory variability, suggesting right atrial pressure of 8 mmHg. IAS/Shunts: No atrial level shunt detected by color flow Doppler.  LEFT VENTRICLE PLAX 2D LVIDd:         5.00 cm  Diastology LVIDs:         2.70 cm  LV e' medial:    7.94 cm/s LV PW:         0.70 cm  LV E/e' medial:  20.3 LV IVS:        0.50 cm  LV e' lateral:   16.20 cm/s LVOT diam:     2.10 cm  LV E/e' lateral: 10.0 LV SV:         80 LV SV Index:   44  LVOT Area:     3.46 cm  RIGHT VENTRICLE RV S prime:     12.10 cm/s TAPSE (M-mode): 2.2 cm LEFT ATRIUM             Index       RIGHT ATRIUM           Index LA diam:        4.90 cm 2.70 cm/m  RA Area:     27.40 cm LA Vol (A2C):   96.3 ml 52.97 ml/m RA Volume:   94.70 ml  52.09 ml/m LA Vol (A4C):   77.4 ml 42.57 ml/m LA Biplane Vol: 88.4 ml 48.62 ml/m  AORTIC VALVE AV Area (Vmax):    2.23 cm AV Area (Vmean):   2.12 cm AV Area (VTI):     2.18 cm AV Vmax:           174.00 cm/s AV Vmean:          110.050 cm/s AV VTI:            0.366 m AV Peak Grad:      12.1 mmHg AV Mean Grad:      6.0 mmHg LVOT Vmax:         112.00 cm/s LVOT Vmean:        67.500 cm/s LVOT VTI:          0.230 m LVOT/AV VTI ratio: 0.63  AORTA Ao Root diam: 3.50 cm Ao Asc diam:  4.15 cm MITRAL VALVE                TRICUSPID VALVE MV Area (PHT): cm          TR Peak grad:   42.0 mmHg MV Area VTI:   1.78 cm     TR Vmax:        324.00 cm/s MV Peak grad:  12.8 mmHg MV Mean grad:  2.0 mmHg     SHUNTS MV Vmax:       1.79 m/s     Systemic VTI:  0.23 m MV Vmean:      59.2 cm/s    Systemic Diam: 2.10 cm MV Decel Time: 193 msec MR Peak grad: 121.9 mmHg MR Mean grad: 86.5 mmHg MR Vmax:      552.00 cm/s MR Vmean:     447.0 cm/s MV E velocity: 161.50 cm/s Oswaldo Milian MD Electronically signed by Oswaldo Milian MD Signature Date/Time: 10/09/2020/8:14:24 PM    Final    CBC Latest Ref Rng & Units 10/21/2020 10/09/2020 10/02/2020  WBC 4.0 - 10.5 K/uL - - -  Hemoglobin 12.0 - 15.0 g/dL 9.1(L) 8.1(L) 7.9 Repeated and verified X2.(LL)  Hematocrit 36.0 - 46.0 % - - -  Platelets 150 - 400 K/uL - - -    Lab Results  Component Value Date   CREATININE 0.76 10/09/2020   BUN 12 10/09/2020   NA 135 10/09/2020   K 3.6 10/09/2020   CL 100 10/09/2020   CO2 26 10/09/2020    Lab Results  Component Value Date   ALT 9 09/24/2020   AST 18 09/24/2020   ALKPHOS 60 09/24/2020   BILITOT 0.8 09/24/2020     Physical Exam: BP 130/80   Pulse 78   Ht  5' 5"  (1.651 m)   Wt 155 lb (70.3 kg)   BMI 25.79 kg/m  Constitutional: Pleasant,well-developed, female in no acute distress. Neurological: Alert and oriented to person place and time. Psychiatric: Normal mood and affect. Behavior is normal.   ASSESSMENT AND PLAN: 75 year old female here for reassessment of the following:  Iron deficiency anemia History of gastric bypass Abnormal appearing liver  As above, during routine office follow-up the last time I saw her she was fatigued and incidentally had a hemoglobin of 5.7.  Microcytic anemia with iron deficiency, no overt blood loss and negative for fecal occult blood testing on multiple specimens.  She has received PRBC transfusion and now on oral iron and her hemoglobin is slowly uptrending and she is feeling much better.  We discussed differential diagnosis for anemia.  She has had a prior EGD and colonoscopy for this issue in 2020.  I suspect her progressive anemia came from lack of iron supplementation in the setting of gastric bypass anatomy.  Our options are to continue iron supplementation and monitor hemoglobin right now, versus proceeding with upper endoscopy/enteroscopy to reevaluate her upper tract.  I discussed risk benefits of each of these options, the patient really does not want an endoscopic evaluation at this time and is comfortable continuing iron and monitoring.  If she notices any overt bleeding she will contact us.  I will repeat her hemoglobin in 3 weeks, continue ferrous sulfate 325 twice daily and avoid NSAIDs.  Otherwise she is status post intraoperative ERCP with cholecystectomy done at [facility] a few months ago.  Told she may have a cirrhotic liver based on the appearance of it at that time but she did not have a liver biopsy.  She has had not had any overt evidence of cirrhosis on imaging.  She has no evidence of portal hypertension on imaging, no splenomegaly, her platelets are normal.  If she does have cirrhosis she would  appear to be compensated.  We will repeat an ultrasound in June for reassessment of this.  If she really wants to know if she has cirrhosis we may consider liver biopsy pending her course.  She will see me again in August for follow-up.  She is abstaining from alcohol  Plan: - continue ferrous sulfate 332m BID - avoid NSAIDs - repeat Hgb in 3 weeks - patient declines upper endoscopy / endoscopic evaluation right now which I think is reasonable given her history / prior workup - repeat [place] in June - consideration for liver biopsy pending her course - follow up with me in the office in August  SCarolina Cellar MD [facility]

## 2020-11-06 ENCOUNTER — Telehealth: Payer: Self-pay

## 2020-11-06 NOTE — Telephone Encounter (Signed)
-----   Message from Roetta Sessions, [place] sent at 10/22/2020  8:30 AM EDT ----- Regarding: due for Hgb week of 4-18 Due for hgb week of 4-18

## 2020-11-06 NOTE — Telephone Encounter (Signed)
Called and spoke to patient. She will go to the lab one day next week. 

## 2020-11-14 ENCOUNTER — Other Ambulatory Visit (INDEPENDENT_AMBULATORY_CARE_PROVIDER_SITE_OTHER): Payer: Medicare Other

## 2020-11-14 DIAGNOSIS — D649 Anemia, unspecified: Secondary | ICD-10-CM | POA: Diagnosis not present

## 2020-11-14 LAB — HEMOGLOBIN: Hemoglobin: 10.8 g/dL — ABNORMAL LOW (ref 12.0–15.0)

## 2020-11-15 ENCOUNTER — Other Ambulatory Visit: Payer: Self-pay

## 2020-11-15 DIAGNOSIS — D509 Iron deficiency anemia, unspecified: Secondary | ICD-10-CM

## 2020-11-19 ENCOUNTER — Ambulatory Visit: Payer: Medicare Other | Admitting: Gastroenterology

## 2020-12-18 ENCOUNTER — Other Ambulatory Visit: Payer: Self-pay

## 2020-12-18 DIAGNOSIS — R932 Abnormal findings on diagnostic imaging of liver and biliary tract: Secondary | ICD-10-CM

## 2020-12-18 NOTE — Progress Notes (Signed)
Order for RUQ liver ultrasound entered. Patient due in June due to abnormal liver imaging after cirrhosis was noted during ERCP/cholecystectomy. See office note from 10-26-20.

## 2020-12-19 ENCOUNTER — Telehealth: Payer: Self-pay

## 2020-12-19 NOTE — Telephone Encounter (Signed)
-----   Message from Roetta Sessions, [place] sent at 10/25/2020 10:44 AM EDT ----- Regarding: due for U/S in June U/S in June for abn liver imaging.  Did patient call back to get scheduled?

## 2020-12-19 NOTE — Telephone Encounter (Signed)
Called and spoke to patient.  She questioned wether she was due in June or perhaps August.  I clarified what Dr. Mickey Farber recommendation was at last office visit. She is coming for labs today and agreed to U/S in late June. Scheduled patient for Tuesday, June 28th at [facility] and spoke to patient and gave her all the appointment information.

## 2021-01-06 ENCOUNTER — Telehealth: Payer: Self-pay

## 2021-01-06 NOTE — Telephone Encounter (Signed)
-----   Message from Roetta Sessions, [place] sent at 11/15/2020  9:28 AM EDT ----- Regarding: cbc Patient due for CBC in mid June

## 2021-01-06 NOTE — Telephone Encounter (Signed)
MyChart message sent to patient to come for CBC for Dr. Havery Moros

## 2021-01-14 ENCOUNTER — Other Ambulatory Visit (INDEPENDENT_AMBULATORY_CARE_PROVIDER_SITE_OTHER): Payer: Medicare Other

## 2021-01-14 DIAGNOSIS — D509 Iron deficiency anemia, unspecified: Secondary | ICD-10-CM

## 2021-01-14 LAB — CBC WITH DIFFERENTIAL/PLATELET
Basophils Absolute: 0.1 10*3/uL (ref 0.0–0.1)
Basophils Relative: 1.4 % (ref 0.0–3.0)
Eosinophils Absolute: 0.5 10*3/uL (ref 0.0–0.7)
Eosinophils Relative: 9 % — ABNORMAL HIGH (ref 0.0–5.0)
HCT: 34.5 % — ABNORMAL LOW (ref 36.0–46.0)
Hemoglobin: 11.5 g/dL — ABNORMAL LOW (ref 12.0–15.0)
Lymphocytes Relative: 10.7 % — ABNORMAL LOW (ref 12.0–46.0)
Lymphs Abs: 0.5 10*3/uL — ABNORMAL LOW (ref 0.7–4.0)
MCHC: 33.4 g/dL (ref 30.0–36.0)
MCV: 91.1 fl (ref 78.0–100.0)
Monocytes Absolute: 0.7 10*3/uL (ref 0.1–1.0)
Monocytes Relative: 13.5 % — ABNORMAL HIGH (ref 3.0–12.0)
Neutro Abs: 3.4 10*3/uL (ref 1.4–7.7)
Neutrophils Relative %: 65.4 % (ref 43.0–77.0)
Platelets: 167 10*3/uL (ref 150.0–400.0)
RBC: 3.79 Mil/uL — ABNORMAL LOW (ref 3.87–5.11)
RDW: 15.7 % — ABNORMAL HIGH (ref 11.5–15.5)
WBC: 5.1 10*3/uL (ref 4.0–10.5)

## 2021-01-19 ENCOUNTER — Other Ambulatory Visit: Payer: Self-pay | Admitting: Cardiology

## 2021-01-20 NOTE — Telephone Encounter (Signed)
29f, 70.3kg, scr 0.76, lovw/hochrein 09/13/20

## 2021-01-21 ENCOUNTER — Ambulatory Visit (HOSPITAL_COMMUNITY): Admission: RE | Admit: 2021-01-21 | Payer: Medicare Other | Source: Ambulatory Visit

## 2021-01-25 DIAGNOSIS — Z79891 Long term (current) use of opiate analgesic: Secondary | ICD-10-CM | POA: Diagnosis not present

## 2021-01-25 DIAGNOSIS — M5416 Radiculopathy, lumbar region: Secondary | ICD-10-CM | POA: Diagnosis not present

## 2021-02-26 DIAGNOSIS — Z20822 Contact with and (suspected) exposure to covid-19: Secondary | ICD-10-CM | POA: Diagnosis not present

## 2021-03-01 DIAGNOSIS — I272 Pulmonary hypertension, unspecified: Secondary | ICD-10-CM | POA: Insufficient documentation

## 2021-03-01 NOTE — Progress Notes (Signed)
Cardiology Office Note   Date:  03/03/2021   ID:  Hannah Collier, DOB Dec 03, 1945, MRN [id number]  PCP:  Gaynelle Arabian, MD  Cardiologist:   Minus Breeding, MD   Chief Complaint  Patient presents with   Atrial Fibrillation       History of Present Illness: Hannah Collier is a 75 y.o. female who presents for follow up of atrial fib.  She was in the ED with symptomatic anemia in March.   On echo last year there was moderate TR with moderate pulmonary HTN.  Is not clear why she continues to have anemia.  She says that her stool guaiacs have been negative and there is been no obvious GI source.  She gets around slowly and had a fall the other day and so walks with a cane not to lose her footing.  She had transfusion and now she is getting her hemoglobin checked monthly.  Her last hemoglobin was up above 11 and she feels so much better than when it was around 6.  She is not having any chest discomfort, neck or arm discomfort.  She has no new shortness of breath, PND or orthopnea.  She occasionally has some palpitations but does not really know that she is in atrial fibrillation and does not ever see that her heart rate is increased.   Past Medical History:  Diagnosis Date   Anemia    Arthritis    Atrial fibrillation ([place])    Choledocholithiasis    Depression    Diverticulosis 2020   Gastric bypass status for obesity    Iron deficiency anemia    PCOS (polycystic ovarian syndrome)    Tricuspid regurgitation     Past Surgical History:  Procedure Laterality Date   CHOLECYSTECTOMY     with ERCP   COLONOSCOPY WITH PROPOFOL N/A 09/13/2018   Procedure: COLONOSCOPY WITH PROPOFOL;  Surgeon: Ladene Artist, MD;  Location: [facility] ENDOSCOPY;  Service: Endoscopy;  Laterality: N/A;   COLONOSCOPY WITH PROPOFOL N/A 09/24/2018   Procedure: COLONOSCOPY WITH PROPOFOL;  Surgeon: Mauri Pole, MD;  Location: [facility] ENDOSCOPY;  Service: Endoscopy;  Laterality: N/A;   ESOPHAGOGASTRODUODENOSCOPY  (EGD) WITH PROPOFOL N/A 09/12/2018   Procedure: ESOPHAGOGASTRODUODENOSCOPY (EGD) WITH PROPOFOL;  Surgeon: Ladene Artist, MD;  Location: [facility] ENDOSCOPY;  Service: Endoscopy;  Laterality: N/A;   POLYPECTOMY  09/13/2018   Procedure: POLYPECTOMY;  Surgeon: Ladene Artist, MD;  Location: [facility] ENDOSCOPY;  Service: Endoscopy;;   REPLACEMENT TOTAL KNEE     TONSILLECTOMY       Current Outpatient Medications  Medication Sig Dispense Refill   acetaminophen (TYLENOL) 500 MG tablet Take 500 mg by mouth 2 (two) times daily.     apixaban (ELIQUIS) 5 MG TABS tablet TAKE 1 TABLET TWICE A DAY 180 tablet 1   cetirizine (ZYRTEC) 10 MG tablet Take 10 mg by mouth daily.     digoxin (LANOXIN) 0.125 MG tablet Take 1 tablet (0.125 mg total) by mouth daily. 90 tablet 2   ferrous sulfate 324 (65 Fe) MG TBEC Take 1 tablet (324 mg total) by mouth daily. (Patient taking differently: Take 324 mg by mouth in the morning and at bedtime.) 30 tablet 3   hydrOXYzine (ATARAX/VISTARIL) 25 MG tablet Take 25 mg by mouth 3 (three) times daily as needed.     metoprolol succinate (TOPROL-XL) 25 MG 24 hr tablet Take by mouth daily.     oxyCODONE (OXY IR/ROXICODONE) 5 MG immediate release tablet Take 5 mg by mouth  3 (three) times daily as needed.     furosemide (LASIX) 40 MG tablet Take 1 tablet (40 mg total) by mouth daily as needed for fluid or edema. (Patient not taking: Reported on 03/03/2021) 30 tablet 0   hydrOXYzine (VISTARIL) 25 MG/ML injection Inject into the muscle. (Patient not taking: Reported on 03/03/2021)     metoprolol tartrate (LOPRESSOR) 25 MG tablet Take 0.5 tablets (12.5 mg total) by mouth daily as needed. (Patient not taking: Reported on 03/03/2021) 30 tablet 6   No current facility-administered medications for this visit.    Allergies:   Sulfa antibiotics     ROS:  Please see the history of present illness.   Otherwise, review of systems are positive for none.   All other systems are reviewed and negative.     PHYSICAL EXAM: VS:  BP 136/80 (BP Location: Left Arm, Patient Position: Sitting, Cuff Size: Normal)   Pulse 82   Ht '5\' 5"'$  (1.651 m)   Wt 169 lb 3.2 oz (76.7 kg)   SpO2 99%   BMI 28.16 kg/m  , BMI Body mass index is 28.16 kg/m. GENERAL:  Well appearing NECK:  No jugular venous distention, waveform within normal limits, carotid upstroke brisk and symmetric, no bruits, no thyromegaly LUNGS:  Clear to auscultation bilaterally CHEST:  Unremarkable HEART:  PMI not displaced or sustained,S1 and S2 within normal limits, no S3, no clicks, no rubs, no murmurs, irregular ABD:  Flat, positive bowel sounds normal in frequency in pitch, no bruits, no rebound, no guarding, no midline pulsatile mass, no hepatomegaly, no splenomegaly EXT:  2 plus pulses throughout, no edema, no cyanosis no clubbing   EKG:  EKG is  ordered today. Atrial fibrillation, rate 82, leftward axis, poor anterior R wave progression, nonspecific ST-T wave.  Recent Labs: 09/24/2020: ALT 9 10/09/2020: BUN 12; Creatinine, Ser 0.76; Potassium 3.6; Sodium 135 01/14/2021: Hemoglobin 11.5; Platelets 167.0    Lipid Panel No results found for: CHOL, TRIG, HDL, CHOLHDL, VLDL, LDLCALC, LDLDIRECT    Wt Readings from Last 3 Encounters:  03/03/21 169 lb 3.2 oz (76.7 kg)  10/25/20 155 lb (70.3 kg)  09/24/20 164 lb (74.4 kg)      Other studies Reviewed: Additional studies/ records that were reviewed today include: ED records Review of the above records demonstrates:  Please see elsewhere in the note.     ASSESSMENT AND PLAN:  PERSISTENT ATRIAL FIB:    She is tolerating current rate control and anticoagulation.  No change in therapy.  ANEMIA:   As above there is no indication that she needs to stop her anticoagulation.  She might need transfusions periodically which I think is very reasonable.  She will have her hemoglobin followed routinely.   TR: This was moderate.  She had some moderately elevated pulmonary pressures.  It  is stable.  I will check this again in March of next year.  Current medicines are reviewed at length with the patient today.  The patient does not have concerns regarding medicines.  The following changes have been made: None  Labs/ tests ordered today include:     Orders Placed This Encounter  Procedures   EKG 12-Lead   ECHOCARDIOGRAM COMPLETE      Disposition:   FU with me in 12 months.    Signed, Minus Breeding, MD  03/03/2021 3:27 PM    [facility]

## 2021-03-03 ENCOUNTER — Ambulatory Visit (INDEPENDENT_AMBULATORY_CARE_PROVIDER_SITE_OTHER): Payer: Medicare Other | Admitting: Cardiology

## 2021-03-03 ENCOUNTER — Other Ambulatory Visit: Payer: Medicare Other

## 2021-03-03 ENCOUNTER — Encounter: Payer: Self-pay | Admitting: Cardiology

## 2021-03-03 ENCOUNTER — Other Ambulatory Visit: Payer: Self-pay

## 2021-03-03 VITALS — BP 136/80 | HR 82 | Ht 65.0 in | Wt 169.2 lb

## 2021-03-03 DIAGNOSIS — I4819 Other persistent atrial fibrillation: Secondary | ICD-10-CM | POA: Diagnosis not present

## 2021-03-03 DIAGNOSIS — I272 Pulmonary hypertension, unspecified: Secondary | ICD-10-CM | POA: Diagnosis not present

## 2021-03-03 DIAGNOSIS — I361 Nonrheumatic tricuspid (valve) insufficiency: Secondary | ICD-10-CM | POA: Diagnosis not present

## 2021-03-03 NOTE — Patient Instructions (Signed)
Medication Instructions:  No changes *If you need a refill on your cardiac medications before your next appointment, please call your pharmacy*   Lab Work: None ordered If you have labs (blood work) drawn today and your tests are completely normal, you will receive your results only by: [place] (if you have MyChart) OR A paper copy in the mail If you have any lab test that is abnormal or we need to change your treatment, we will call you to review the results.   Testing/Procedures: Your physician has requested that you have an echocardiogram in March 2023. Echocardiography is a painless test that uses sound waves to create images of your heart. It provides your doctor with information about the size and shape of your heart and how well your heart's chambers and valves are working. You may receive an ultrasound enhancing agent through an IV if needed to better visualize your heart during the echo.This procedure takes approximately one hour. There are no restrictions for this procedure. This will take place at the [address].     Follow-Up: At [facility], you and your health needs are our priority.  As part of our continuing mission to provide you with exceptional heart care, we have created designated Provider Care Teams.  These Care Teams include your primary Cardiologist (physician) and Advanced Practice Providers (APPs -  Physician Assistants and Nurse Practitioners) who all work together to provide you with the care you need, when you need it.  We recommend signing up for the patient portal called "MyChart".  Sign up information is provided on this After Visit Summary.  MyChart is used to connect with patients for Virtual Visits (Telemedicine).  Patients are able to view lab/test results, encounter notes, upcoming appointments, etc.  Non-urgent messages can be sent to your provider as well.   To learn more about what you can do with MyChart, go to  [email or web address].    Your next appointment:   6 month(s)  The format for your next appointment:   In Person  Provider:   You may see Minus Breeding, MD or one of the following Advanced Practice Providers on your designated Care Team:   Rosaria Ferries, PA-C Caron Presume, PA-C Jory Sims, DNP, ANP

## 2021-03-06 ENCOUNTER — Ambulatory Visit: Payer: Medicare Other | Admitting: Cardiology

## 2021-03-07 ENCOUNTER — Ambulatory Visit: Payer: Medicare Other | Admitting: Gastroenterology

## 2021-03-10 ENCOUNTER — Other Ambulatory Visit: Payer: Self-pay

## 2021-03-10 DIAGNOSIS — D509 Iron deficiency anemia, unspecified: Secondary | ICD-10-CM

## 2021-03-19 DIAGNOSIS — M79672 Pain in left foot: Secondary | ICD-10-CM | POA: Diagnosis not present

## 2021-03-19 DIAGNOSIS — M2022 Hallux rigidus, left foot: Secondary | ICD-10-CM | POA: Diagnosis not present

## 2021-03-19 DIAGNOSIS — M21612 Bunion of left foot: Secondary | ICD-10-CM | POA: Diagnosis not present

## 2021-03-19 DIAGNOSIS — M79671 Pain in right foot: Secondary | ICD-10-CM | POA: Diagnosis not present

## 2021-03-24 ENCOUNTER — Telehealth: Payer: Self-pay

## 2021-03-24 NOTE — Telephone Encounter (Signed)
-----   Message from Roetta Sessions, [place] sent at 03/10/2021  9:34 AM EDT ----- Regarding: FW: recheck cbc Ask patient to go to lab prior to appointment on 9-7 for CBC. Order is in. (lab will be closed on Monday 9-5)   ----- Message ----- From: Yetta Flock, MD Sent: 03/10/2021   9:15 AM EDT To: Roetta Sessions, CMA Subject: RE: recheck cbc                                Thanks Jan. Let's check a CBC prior to her visit in September. Thanks  ----- Message ----- From: Roetta Sessions, CMA Sent: 03/10/2021   7:00 AM EDT To: Yetta Flock, MD Subject: recheck cbc                                    Patient was scheduled to see you this week but reschedule until September. She has been very inconsistent with F/U.  Did you want her to have blood work prior to her September appointment? thanks

## 2021-03-24 NOTE — Telephone Encounter (Signed)
Called and Left detailed message for patient to go to the lab prior to appointment next week.  Lab will be closed on Monday

## 2021-04-01 ENCOUNTER — Other Ambulatory Visit: Payer: Self-pay | Admitting: Cardiology

## 2021-04-02 ENCOUNTER — Ambulatory Visit: Payer: Medicare Other | Admitting: Gastroenterology

## 2021-05-12 ENCOUNTER — Telehealth: Payer: Self-pay | Admitting: Gastroenterology

## 2021-05-12 NOTE — Telephone Encounter (Signed)
Called and scheduled patient for RUQ U/S at [facility] on Monday 10-24 at 8:00 am, to arrive at 7:45 am. Lab orders are in. MyChart message sent to patient with appointment information and lab hours.

## 2021-05-12 NOTE — Telephone Encounter (Signed)
Pt called regarding having bloodwork done prior to her OV with Dr. Havery Moros 05/21/21 and wanted to make sure there was an order for her to have that done.  She also said she needed to have an ultrasound done as well and would require an order for that as well.  Please call patient and advise.  Thank you.

## 2021-05-14 NOTE — Telephone Encounter (Signed)
Called and spoke to patient. Relayed information about U/S on Monday 10-24 at 8:00 am and labs before appointment next week. Gave patient the number if she wanted to reschedule U/S appointment on Monday but she indicated she will make that 8:00 am appointment work and will see her next week on 10-26 at her OV

## 2021-05-19 ENCOUNTER — Other Ambulatory Visit (INDEPENDENT_AMBULATORY_CARE_PROVIDER_SITE_OTHER): Payer: Medicare Other

## 2021-05-19 ENCOUNTER — Other Ambulatory Visit: Payer: Self-pay

## 2021-05-19 ENCOUNTER — Ambulatory Visit (HOSPITAL_COMMUNITY)
Admission: RE | Admit: 2021-05-19 | Discharge: 2021-05-19 | Disposition: A | Discharge status: Home / Self Care | Payer: Medicare Other | Source: Ambulatory Visit | Attending: Gastroenterology | Admitting: Gastroenterology

## 2021-05-19 DIAGNOSIS — D509 Iron deficiency anemia, unspecified: Secondary | ICD-10-CM | POA: Diagnosis not present

## 2021-05-19 DIAGNOSIS — K7689 Other specified diseases of liver: Secondary | ICD-10-CM | POA: Diagnosis not present

## 2021-05-19 DIAGNOSIS — R932 Abnormal findings on diagnostic imaging of liver and biliary tract: Secondary | ICD-10-CM | POA: Insufficient documentation

## 2021-05-19 LAB — CBC WITH DIFFERENTIAL/PLATELET
Basophils Absolute: 0 10*3/uL (ref 0.0–0.1)
Basophils Relative: 0.9 % (ref 0.0–3.0)
Eosinophils Absolute: 0.4 10*3/uL (ref 0.0–0.7)
Eosinophils Relative: 8.3 % — ABNORMAL HIGH (ref 0.0–5.0)
HCT: 38 % (ref 36.0–46.0)
Hemoglobin: 12.6 g/dL (ref 12.0–15.0)
Lymphocytes Relative: 11.5 % — ABNORMAL LOW (ref 12.0–46.0)
Lymphs Abs: 0.5 10*3/uL — ABNORMAL LOW (ref 0.7–4.0)
MCHC: 33.1 g/dL (ref 30.0–36.0)
MCV: 97.1 fl (ref 78.0–100.0)
Monocytes Absolute: 0.6 10*3/uL (ref 0.1–1.0)
Monocytes Relative: 13.9 % — ABNORMAL HIGH (ref 3.0–12.0)
Neutro Abs: 2.9 10*3/uL (ref 1.4–7.7)
Neutrophils Relative %: 65.4 % (ref 43.0–77.0)
Platelets: 178 10*3/uL (ref 150.0–400.0)
RBC: 3.92 Mil/uL (ref 3.87–5.11)
RDW: 14.7 % (ref 11.5–15.5)
WBC: 4.4 10*3/uL (ref 4.0–10.5)

## 2021-05-19 NOTE — Addendum Note (Signed)
Addended by: Octavio Manns E on: 05/19/2021 08:47 AM   Modules accepted: Orders

## 2021-05-21 ENCOUNTER — Ambulatory Visit (INDEPENDENT_AMBULATORY_CARE_PROVIDER_SITE_OTHER): Payer: Medicare Other | Admitting: Gastroenterology

## 2021-05-21 ENCOUNTER — Encounter: Payer: Self-pay | Admitting: Gastroenterology

## 2021-05-21 VITALS — BP 168/80 | HR 92 | Ht 65.0 in | Wt 169.2 lb

## 2021-05-21 DIAGNOSIS — R932 Abnormal findings on diagnostic imaging of liver and biliary tract: Secondary | ICD-10-CM | POA: Diagnosis not present

## 2021-05-21 DIAGNOSIS — D509 Iron deficiency anemia, unspecified: Secondary | ICD-10-CM | POA: Diagnosis not present

## 2021-05-21 DIAGNOSIS — K760 Fatty (change of) liver, not elsewhere classified: Secondary | ICD-10-CM

## 2021-05-21 DIAGNOSIS — Z9884 Bariatric surgery status: Secondary | ICD-10-CM | POA: Diagnosis not present

## 2021-05-21 NOTE — Patient Instructions (Addendum)
If you are age 74 or older, your body mass index should be between 23-30. Your Body mass index is 28.16 kg/m. If this is out of the aforementioned range listed, please consider follow up with your Primary Care Provider.  If you are age 7 or younger, your body mass index should be between 19-25. Your Body mass index is 28.16 kg/m. If this is out of the aformentioned range listed, please consider follow up with your Primary Care Provider.   ________________________________________________________  The [facility] providers would like to encourage you to use [facility] to communicate with providers for non-urgent requests or questions.  Due to long hold times on the telephone, sending your provider a message by [facility] may be a faster and more efficient way to get a response.  Please allow 48 business hours for a response.  Please remember that this is for non-urgent requests.  _______________________________________________________  Please go to the lab in the basement of our building to have lab work done as you leave today. Hit "B" for basement when you get on the elevator.  When the doors open the lab is on your left.  We will call you with the results. Thank you.  You will be due for EGD/Colonoscopy in February of 2023.  You will be due for an abdominal ultrasound in April of 2023 (Rule out cirrhosis and fatty liver).  Discontinue alcohol.  Continue oral iron.  Thank you for entrusting me with your care and for choosing [facility], Dr. [place]

## 2021-05-21 NOTE — Progress Notes (Signed)
HPI :  75 year old female here for follow-up visit. Recall she has a history of remote Roux-en-Y, history of alcohol use, A. fib on Eliquis, history of iron deficiency anemia and GI bleeding in 2020, history of choledocholithiasis.    In recent years she has seen me for choledocholithiasis.  She presented to [facility] and underwent intraoperative ERCP with cholecystectomy.  She later presented in March of this year after that hospitalization feeling extremely fatigued while on Eliquis and presented with a hemoglobin of 5.7.  She had brown stools and no overt blood loss, got blood transfusion and IV iron.  She declined upper and lower endoscopy at that time given she had no overt bleeding and was not on iron, thought to be result of post gastric bypass anatomy.  Recall she was admitted in February 2020 for anemia and dark stools, she underwent an EGD and colonoscopy at that time which did not show any significant pathology other than multiple right-sided polyps, the largest was 14 mm adenoma.  She unfortunately had a post polypectomy bleed that required endoscopic intervention shortly thereafter.  She did not undergo any endoscopic evaluation when readmitted to the hospital.   She is here today and states she is feeling pretty well.  She does not have any blood in the stools at all.  Her bowels are typically dark as she is taking iron twice daily, 325 mg of ferrous sulfate.  She does not have any abdominal pains.  She is avoiding NSAIDs.  Only using Tylenol as needed.  She generally is eating okay but appetite can fluctuate.  No nausea or vomiting, no reflux symptoms.  She continues to take Eliquis for A. fib.  Denies any cardiopulmonary symptoms on the regimen.  Her most recent CBC in October showed normalization hemoglobin to 12.6  Recall at [place] suspected she may have cirrhosis based on intraoperative finding during her ERCP with cholecystectomy.  She did not have a liver biopsy at that time.  At the  last visit we discussed that her spleen is normal in appearance, platelet count is normal, no evidence of portal hypertension.  We discussed her surveillance ultrasound this past summer but she was only able to get it done recently.  Results as outlined below.  Suggestive of fatty liver with questionable nodular contour.  Denies any jaundice.  No ascites, no internal bleeding.  No encephalopathy.  She states when she called over the summer she was told she probably has cirrhosis.  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  She has been doing this for the past few months.  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 - Gastric bypass with a normal-sized pouch and intact staple line. Gastrojejunal anastomosis characterized by healthy appearing mucosa and visible sutures. - Small hiatal hernia. - Normal examined jejunum. - No specimens collected.   Feb 2020 colonoscopy for melena and negative EGD -Three 7 to 14 mm polyps in the ascending colon and in the cecum, removed with a hot snare. Resected and retrieved. - Moderate diverticulosis in the left colon. There was narrowing of the colon in association with the diverticular opening. There was evidence of diverticular spasm. There was no evidence of diverticular  bleeding. - The examination was otherwise normal on direct and retroflexion views.   Colon, polyp(s), cecum and ascending - MULTIPLE FRAGMENTS OF TUBULAR ADENOMA. - SESSILE SERRATED POLYP (X3 FRAGMENTS), INDEFINITE FOR DYSPLASIA, SEE COMMENT.   [place] in Feb 2020 remarkable for choleithiaiis and CBD of 8 mm.    04/21/20  lumbar MRI  IMPRESSION: 1. Choledocholithiasis with marked bile  duct dilatation that has progressed from a 2020 ultrasound. 2. 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 3. Moderate spinal stenosis at L1-2 and L3-4. 4. Moderate foraminal narrowing on both sides at L3-4 and on the left at L5-S1   On 07/01/20 - Patient was taken to the operating room on 07/01/2020 by Wray Kearns MD and Obie Dredge MD who performed a laparoscopic assisted ERCP and cholecystectomy . Intraop findings notable for cirrhosis   CT scan 06/27/20 - Impression:  Choledocholithiasis with 1.3 cm obstructing stone in the distal common bile  duct (series 5 image 44).  calcified spleen, calfications in the liver, no masses - no mention of cirrhosis   FOBT negative on 3/1 FOBT negative again x 3 on 10/14/20   Echo 10/09/20 - EF 60-65%    RUQ [place] 05/19/21 - IMPRESSION: 1. Prior cholecystectomy. Common bile duct is again noted to be slightly dilated at 9 mm. Common bile duct dilatation can be seen following cholecystectomy. If further evaluation is needed to exclude common bile duct obstruction, MRI of the abdomen with MRCP can be obtained.   2. Coarse hepatic echotexture consistent with fatty infiltration or hepatocellular disease again noted. The liver contour is slightly irregular. Cirrhosis cannot be excluded. No focal hepatic abnormality identified. Portal vein is patent with normal direction of flow.  Labs 05/19/21 -  Hgb 12.6, MCV 97  Labs 01/14/21 -  Hgb 11.5, MCV 91       Past Medical History:  Diagnosis Date   Anemia    Arthritis    Atrial fibrillation ([place])    Choledocholithiasis    Depression    Diverticulosis 2020   Gastric bypass status for obesity    Iron deficiency anemia    PCOS (polycystic ovarian syndrome)    Tricuspid regurgitation      Past Surgical History:  Procedure Laterality Date   CHOLECYSTECTOMY     with ERCP   COLONOSCOPY WITH PROPOFOL N/A 09/13/2018   Procedure: COLONOSCOPY WITH PROPOFOL;  Surgeon: Ladene Artist, MD;  Location: [facility] ENDOSCOPY;  Service: Endoscopy;  Laterality: N/A;   COLONOSCOPY WITH PROPOFOL N/A 09/24/2018   Procedure: COLONOSCOPY WITH PROPOFOL;  Surgeon: Mauri Pole, MD;  Location: [place] ENDOSCOPY;  Service: Endoscopy;  Laterality: N/A;   ESOPHAGOGASTRODUODENOSCOPY (EGD) WITH PROPOFOL N/A 09/12/2018   Procedure: ESOPHAGOGASTRODUODENOSCOPY (EGD) WITH PROPOFOL;  Surgeon: Ladene Artist, MD;  Location: [facility] ENDOSCOPY;  Service: Endoscopy;  Laterality: N/A;   FOOT FRACTURE SURGERY Left    GASTRIC BYPASS     POLYPECTOMY  09/13/2018   Procedure: POLYPECTOMY;  Surgeon: Ladene Artist, MD;  Location: [facility] ENDOSCOPY;  Service: Endoscopy;;   REPLACEMENT TOTAL KNEE Bilateral    TONSILLECTOMY     Family History  Problem Relation Age of Onset   COPD Mother        Did not know her history   Throat cancer Father    Pancreatic cancer Maternal Uncle    Testicular cancer Maternal Uncle    Colon cancer Neg Hx    Esophageal cancer Neg Hx    Liver disease Neg Hx  Stomach cancer Neg Hx    Social History   Tobacco Use   Smoking status: Never   Smokeless tobacco: Never  Vaping Use   Vaping Use: Never used  Substance Use Topics   Alcohol use: Yes    Comment: 2 drinks per night   Drug use: Not Currently   Current Outpatient Medications  Medication Sig Dispense Refill   acetaminophen (TYLENOL) 500 MG tablet Take 500 mg by mouth 2 (two) times daily.     apixaban (ELIQUIS) 5 MG TABS tablet TAKE 1 TABLET TWICE A DAY 180 tablet 1   cetirizine (ZYRTEC) 10 MG tablet Take 10 mg by mouth daily.     digoxin (LANOXIN) 0.125 MG tablet TAKE 1 TABLET DAILY (SCHEDULE AN APPOINTMENT FOR FUTURE REFILLS) 90 tablet 3   ferrous sulfate 324 (65 Fe) MG TBEC Take 1 tablet (324 mg total) by mouth daily. (Patient taking differently: Take 324 mg by mouth in the morning and at bedtime.) 30 tablet 3   hydrOXYzine (VISTARIL) 25 MG/ML injection Inject into the muscle.     metoprolol tartrate (LOPRESSOR) 25 MG  tablet Take 0.5 tablets (12.5 mg total) by mouth daily as needed. 30 tablet 6   oxyCODONE (OXY IR/ROXICODONE) 5 MG immediate release tablet Take 5 mg by mouth 3 (three) times daily as needed.     furosemide (LASIX) 40 MG tablet Take 1 tablet (40 mg total) by mouth daily as needed for fluid or edema. (Patient not taking: Reported on 03/03/2021) 30 tablet 0   No current facility-administered medications for this visit.   Allergies  Allergen Reactions   Sulfa Antibiotics Rash     Review of Systems: All systems reviewed and negative except where noted in HPI.    US Abdomen Limited RUQ (LIVER/GB)  Result Date: 05/20/2021 CLINICAL DATA:  History of abnormal liver imaging.  Cholecystectomy. EXAM: ULTRASOUND ABDOMEN LIMITED RIGHT UPPER QUADRANT COMPARISON:  Ultrasound 09/11/2018. FINDINGS: Gallbladder: Cholecystectomy. Common bile duct: Diameter: 9 mm. Common bile duct dilatation can be seen following cholecystectomy. If further evaluation is needed to evaluate the common bile duct MRI of the abdomen with MRCP can be obtained. Liver: Coarse hepatic echotexture consistent with fatty infiltration or hepatocellular disease. Hepatic contour is slightly irregular. Cirrhosis cannot be excluded. No focal hepatic abnormality identified. Portal vein is patent on color Doppler imaging with normal direction of blood flow towards the liver. Other: None. IMPRESSION: 1. Prior cholecystectomy. Common bile duct is again noted to be slightly dilated at 9 mm. Common bile duct dilatation can be seen following cholecystectomy. If further evaluation is needed to exclude common bile duct obstruction, MRI of the abdomen with MRCP can be obtained. 2. Coarse hepatic echotexture consistent with fatty infiltration or hepatocellular disease again noted. The liver contour is slightly irregular. Cirrhosis cannot be excluded. No focal hepatic abnormality identified. Portal vein is patent with normal direction of flow. Electronically  Signed   By: Marcello Moores  Register M.D.   On: 05/20/2021 08:14    Physical Exam: BP (!) 168/80   Pulse 92   Ht 5\' 5"  (1.651 m)   Wt 169 lb 3.2 oz (76.7 kg)   BMI 28.16 kg/m  Constitutional: Pleasant,well-developed, female in no acute distress. Neurological: Alert and oriented to person place and time. Psychiatric: Normal mood and affect. Behavior is normal.   ASSESSMENT AND PLAN: 75 year old female here for reassessment of following:  Iron deficiency anemia History of gastric bypass Abnormal liver imaging Fatty liver  As above, on iron supplementation her  anemia has completely resolved.  Prior endoscopic evaluation in 2020 noted.  She declined repeat endoscopic evaluation given she had no overt bleeding, suspect this could have been due to post gastric bypass state in the absence of iron supplementation.  Hemoglobin has normalized.  Doing very well in this regard.  The other concern is suspected cirrhosis based off [facility] operative report.  She did not have a liver biopsy at that time unfortunately.  Her imaging is not overly consistent with cirrhosis, raises the question of it, her spleen has been normal in the past and platelet count has been normal.  We discussed unclear if she truly has cirrhosis or not, certainly possible with her imaging findings but liver biopsy would be diagnostic.  That being said it would likely not change her management at this time.  If she has cirrhosis she is well compensated.  It is possible alcohol use at baseline could have caused this if she does have cirrhosis.  She has resumed drinking and I counseled her on risks of decompensation and HCC moving forward, she really needs to stop routine alcohol use and she is agreeable to do that.  She is due for some basic labs today, will check her immunity to hep a and B and screen for AIH although I think that would be negative.  Her CBD is slightly dilated on ultrasound which is likely postcholecystectomy state, but will  await her LFTs, she has no pain.  We will also screen with AFP.  She is due for surveillance colonoscopy in February given multiple large polyps in the past.  She is agreeable to colonoscopy at that time and would need approval to hold Eliquis.  We discussed if she wanted to have a screening EGD for varices at that time and I think reasonable if we are sedating her for colonoscopy.  She is agreeable to that as well.  Plan: - recall complete US in 6 months - rule out cirrhosis and assess splent - lab today for AFP, LFTs, hep A total AB, hep B surface AB, IgG, SMA, ANA - stop alcohol use as above - EGD and colonoscopy in February - rule out varices, history of colon polyps - continue iron for now, CBC significantly improved  Jolly Mango, MD [facility]

## 2021-05-28 DIAGNOSIS — Z79891 Long term (current) use of opiate analgesic: Secondary | ICD-10-CM | POA: Diagnosis not present

## 2021-05-28 DIAGNOSIS — M5416 Radiculopathy, lumbar region: Secondary | ICD-10-CM | POA: Diagnosis not present

## 2021-05-28 DIAGNOSIS — Z5181 Encounter for therapeutic drug level monitoring: Secondary | ICD-10-CM | POA: Diagnosis not present

## 2021-08-18 ENCOUNTER — Telehealth: Payer: Self-pay

## 2021-08-18 ENCOUNTER — Other Ambulatory Visit: Payer: Self-pay | Admitting: Cardiology

## 2021-08-18 NOTE — Telephone Encounter (Signed)
Prescription refill request for Eliquis received. Indication:Afib Last office visit:8/22 Scr:0.7 Age: 76 Weight:76.7 kg  Prescription refilled

## 2021-08-18 NOTE — Telephone Encounter (Signed)
Lab orders expire 1-25, Wednesday:  Hep A antibody total Hep B surface antibody qualitative Anti Smooth ANA AFP LFTs

## 2021-08-18 NOTE — Telephone Encounter (Signed)
Called and left detailed message for patient that Dr. Havery Moros would like to get her scheduled for EGD (screen for varices) And colonoscopy (hx of multiple large polyps).  She is also overdue for labs (orders are in). Can schedule her for OV if she prefers

## 2021-08-19 NOTE — Telephone Encounter (Signed)
Called patient and LM to go to labs today as orders are expiring tomorrow.  Asked her to call back to discuss procedure or OV

## 2021-09-11 DIAGNOSIS — M542 Cervicalgia: Secondary | ICD-10-CM | POA: Diagnosis not present

## 2021-09-11 DIAGNOSIS — I4891 Unspecified atrial fibrillation: Secondary | ICD-10-CM | POA: Diagnosis not present

## 2021-09-11 DIAGNOSIS — D6869 Other thrombophilia: Secondary | ICD-10-CM | POA: Diagnosis not present

## 2021-09-11 DIAGNOSIS — M5416 Radiculopathy, lumbar region: Secondary | ICD-10-CM | POA: Diagnosis not present

## 2021-09-23 DIAGNOSIS — M5416 Radiculopathy, lumbar region: Secondary | ICD-10-CM | POA: Diagnosis not present

## 2021-10-03 ENCOUNTER — Ambulatory Visit: Payer: Medicare Other | Admitting: Cardiology

## 2021-10-22 DIAGNOSIS — Z20822 Contact with and (suspected) exposure to covid-19: Secondary | ICD-10-CM | POA: Diagnosis not present

## 2021-10-27 ENCOUNTER — Encounter (HOSPITAL_COMMUNITY): Payer: Self-pay | Admitting: *Deleted

## 2021-10-27 ENCOUNTER — Emergency Department (HOSPITAL_COMMUNITY)
Admission: EM | Admit: 2021-10-27 | Discharge: 2021-10-27 | Disposition: A | Discharge status: Home / Self Care | Payer: Medicare Other | Attending: Emergency Medicine | Admitting: Emergency Medicine

## 2021-10-27 ENCOUNTER — Emergency Department (HOSPITAL_COMMUNITY): Payer: Medicare Other

## 2021-10-27 ENCOUNTER — Other Ambulatory Visit: Payer: Self-pay

## 2021-10-27 DIAGNOSIS — I4891 Unspecified atrial fibrillation: Secondary | ICD-10-CM | POA: Insufficient documentation

## 2021-10-27 DIAGNOSIS — F1010 Alcohol abuse, uncomplicated: Secondary | ICD-10-CM | POA: Insufficient documentation

## 2021-10-27 DIAGNOSIS — R7401 Elevation of levels of liver transaminase levels: Secondary | ICD-10-CM | POA: Diagnosis not present

## 2021-10-27 DIAGNOSIS — Z79899 Other long term (current) drug therapy: Secondary | ICD-10-CM | POA: Insufficient documentation

## 2021-10-27 DIAGNOSIS — K5730 Diverticulosis of large intestine without perforation or abscess without bleeding: Secondary | ICD-10-CM | POA: Diagnosis not present

## 2021-10-27 DIAGNOSIS — K7030 Alcoholic cirrhosis of liver without ascites: Secondary | ICD-10-CM | POA: Insufficient documentation

## 2021-10-27 DIAGNOSIS — Z7901 Long term (current) use of anticoagulants: Secondary | ICD-10-CM | POA: Diagnosis not present

## 2021-10-27 DIAGNOSIS — K760 Fatty (change of) liver, not elsewhere classified: Secondary | ICD-10-CM | POA: Diagnosis not present

## 2021-10-27 DIAGNOSIS — R531 Weakness: Secondary | ICD-10-CM | POA: Diagnosis not present

## 2021-10-27 DIAGNOSIS — R0602 Shortness of breath: Secondary | ICD-10-CM | POA: Diagnosis not present

## 2021-10-27 DIAGNOSIS — K7460 Unspecified cirrhosis of liver: Secondary | ICD-10-CM | POA: Diagnosis not present

## 2021-10-27 DIAGNOSIS — F419 Anxiety disorder, unspecified: Secondary | ICD-10-CM | POA: Insufficient documentation

## 2021-10-27 DIAGNOSIS — R17 Unspecified jaundice: Secondary | ICD-10-CM

## 2021-10-27 LAB — ETHANOL: Alcohol, Ethyl (B): <10 mg/dL (ref <10)

## 2021-10-27 LAB — HEPATIC FUNCTION PANEL
ALT: 29 U/L (ref 0–44)
AST: 63 U/L — ABNORMAL HIGH (ref 15–41)
Albumin: 4.3 g/dL (ref 3.5–5.0)
Alkaline Phosphatase: 126 U/L (ref 38–126)
Bilirubin, Direct: 1 mg/dL — ABNORMAL HIGH (ref 0.0–0.2)
Indirect Bilirubin: 1.4 mg/dL — ABNORMAL HIGH (ref 0.3–0.9)
Total Bilirubin: 2.4 mg/dL — ABNORMAL HIGH (ref 0.3–1.2)
Total Protein: 7.7 g/dL (ref 6.5–8.1)

## 2021-10-27 LAB — BASIC METABOLIC PANEL
Anion gap: 17 — ABNORMAL HIGH (ref 5–15)
BUN: <5 mg/dL — ABNORMAL LOW (ref 8–23)
CO2: 23 mmol/L (ref 22–32)
Calcium: 9.3 mg/dL (ref 8.9–10.3)
Chloride: 89 mmol/L — ABNORMAL LOW (ref 98–111)
Creatinine, Ser: 1.05 mg/dL — ABNORMAL HIGH (ref 0.44–1.00)
GFR, Estimated: 55 mL/min — ABNORMAL LOW (ref >60)
Glucose, Bld: 83 mg/dL (ref 70–99)
Potassium: 3.5 mmol/L (ref 3.5–5.1)
Sodium: 129 mmol/L — ABNORMAL LOW (ref 135–145)

## 2021-10-27 LAB — URINALYSIS, ROUTINE W REFLEX MICROSCOPIC
Bilirubin Urine: NEGATIVE
Glucose, UA: NEGATIVE mg/dL
Ketones, ur: 80 mg/dL — AB
Nitrite: POSITIVE — AB
Protein, ur: NEGATIVE mg/dL
Specific Gravity, Urine: 1.011 (ref 1.005–1.030)
pH: 5 (ref 5.0–8.0)

## 2021-10-27 LAB — CBC
HCT: 38.2 % (ref 36.0–46.0)
Hemoglobin: 13.1 g/dL (ref 12.0–15.0)
MCH: 31.9 pg (ref 26.0–34.0)
MCHC: 34.3 g/dL (ref 30.0–36.0)
MCV: 92.9 fL (ref 80.0–100.0)
Platelets: 130 10*3/uL — ABNORMAL LOW (ref 150–400)
RBC: 4.11 MIL/uL (ref 3.87–5.11)
RDW: 13.5 % (ref 11.5–15.5)
WBC: 4.9 10*3/uL (ref 4.0–10.5)
nRBC: 0 % (ref 0.0–0.2)

## 2021-10-27 LAB — PROTIME-INR
INR: 1 (ref 0.8–1.2)
Prothrombin Time: 13.5 seconds (ref 11.4–15.2)

## 2021-10-27 LAB — DIGOXIN LEVEL: Digoxin Level: 1 ng/mL (ref 0.8–2.0)

## 2021-10-27 LAB — AMMONIA: Ammonia: 15 umol/L (ref 9–35)

## 2021-10-27 LAB — LIPASE, BLOOD: Lipase: 48 U/L (ref 11–51)

## 2021-10-27 LAB — TROPONIN I (HIGH SENSITIVITY)
Troponin I (High Sensitivity): 8 ng/L (ref <18)
Troponin I (High Sensitivity): 8 ng/L (ref <18)

## 2021-10-27 MED ORDER — SODIUM CHLORIDE 0.9 % IV BOLUS
500.0000 mL | Freq: Once | INTRAVENOUS | Status: AC
  Administered 2021-10-27: 500 mL via INTRAVENOUS

## 2021-10-27 MED ORDER — OXYCODONE-ACETAMINOPHEN 5-325 MG PO TABS
1.0000 | ORAL_TABLET | Freq: Once | ORAL | Status: AC
  Administered 2021-10-27: 1 via ORAL
  Filled 2021-10-27: qty 1

## 2021-10-27 MED ORDER — LORAZEPAM 1 MG PO TABS
1.0000 mg | ORAL_TABLET | Freq: Once | ORAL | Status: AC
  Administered 2021-10-27: 1 mg via ORAL
  Filled 2021-10-27: qty 1

## 2021-10-27 MED ORDER — LORAZEPAM 2 MG/ML IJ SOLN
0.5000 mg | Freq: Once | INTRAMUSCULAR | Status: DC
Start: 2021-10-27 — End: 2021-10-27

## 2021-10-27 MED ORDER — IOHEXOL 300 MG/ML  SOLN
100.0000 mL | Freq: Once | INTRAMUSCULAR | Status: AC | PRN
  Administered 2021-10-27: 100 mL via INTRAVENOUS

## 2021-10-27 MED ORDER — LORAZEPAM 1 MG PO TABS: 1.0000 mg | ORAL_TABLET | Freq: Two times a day (BID) | ORAL | 0 refills | Status: AC

## 2021-10-27 NOTE — ED Notes (Signed)
Pt verbalized understanding of d/c instructions, meds, and followup care. Denies questions. VSS, no distress noted. Steady gait to exit with all belongings.  ?

## 2021-10-27 NOTE — ED Notes (Signed)
Oral fluids provided to patient ?

## 2021-10-27 NOTE — Discharge Instructions (Signed)
Do not drink alcohol.  Take ativan as prescribed for withdrawal. ?Drink clear liquids and eat and non-spicy, bland diet. ?You will need to have a recheck of your liver enzymes and bilirubin in the next two weeks. ?Return if you are having worsening symptoms.  ?

## 2021-10-27 NOTE — ED Triage Notes (Signed)
Pt reports being under a lot of stress for weeks, has hx of anxiety but does not take meds for it. Patient is having sob for several days and lack of appetite for weeks, feels very weak and shaky. Denies swelling to extremities. Hx of afib and reports takings all meds as prescribed.  ?

## 2021-10-27 NOTE — ED Notes (Signed)
Patient transported to CT 

## 2021-10-27 NOTE — ED Provider Notes (Signed)
?[facility] ?Provider Note ? ? ?CSN: [id number] ?Arrival date & time: 10/27/21  6979 ? ?  ? ?History ? ?Chief Complaint  ?Patient presents with  ? Anxiety  ? Shortness of Breath  ? Weakness  ? ? ?Janie Capp is a 76 y.o. female. ? ?HPI ?76 year old female history of A-fib, on Eliquis, on digoxin, presents today with shakiness, anxiety, poor appetite eating that she has not had anything to eat for the past week.  Patient states she comes in because of her daughter.  Her daughter is present here with her.  Her daughter states that she is here from [place] to move the patient out to [place] with her.  The patient is currently living with her husband.  She is the main caregiver to him.  Her daughter states that she has not seen her in person for the past 4 years although she has been in contact by phone.  On her arrival she felt that the patient was very shaky and not doing well.  The patient endorses that she has had some chest discomfort yesterday.  Today she is mainly feeling anxious and shaky.  She had her last drink at midnight.  She has been drinking about 5 glasses of whiskey per day.  Daughter states she has a history of drinking alcohol in the past has never had any detox or any significant reported problems although she has been told that she has liver disease in the past. ? ?  ? ?Home Medications ?Prior to Admission medications   ?Medication Sig Start Date End Date Taking? Authorizing Provider  ?acetaminophen (TYLENOL) 500 MG tablet Take 500 mg by mouth 2 (two) times daily as needed for moderate pain.   Yes [provider]  ?apixaban (ELIQUIS) 5 MG TABS tablet TAKE 1 TABLET TWICE A DAY ?Patient taking differently: Take 5 mg by mouth 2 (two) times daily. 08/18/21  Yes Minus Breeding, MD  ?cetirizine (ZYRTEC) 10 MG tablet Take 10 mg by mouth daily.   Yes [provider]  ?digoxin (LANOXIN) 0.125 MG tablet TAKE 1 TABLET DAILY (SCHEDULE AN APPOINTMENT  FOR FUTURE REFILLS) ?Patient taking differently: Take 0.125 mg by mouth daily. 04/01/21  Yes Minus Breeding, MD  ?ferrous sulfate 324 (65 Fe) MG TBEC Take 1 tablet (324 mg total) by mouth daily. 09/24/20  Yes Armbruster, Carlota Raspberry, MD  ?furosemide (LASIX) 40 MG tablet Take 1 tablet (40 mg total) by mouth daily as needed for fluid or edema. 09/25/20 01/03/22 Yes Florencia Reasons, MD  ?hydrocortisone cream 1 % Apply 1 application. topically daily as needed for itching.   Yes [provider]  ?hydrOXYzine (ATARAX) 25 MG tablet Take 12.5 mg by mouth at bedtime as needed for itching.   Yes [provider]  ?LORazepam (ATIVAN) 1 MG tablet Take 1 tablet (1 mg total) by mouth 2 (two) times daily. 10/27/21  Yes Pattricia Boss, MD  ?oxyCODONE (OXY IR/ROXICODONE) 5 MG immediate release tablet Take 5 mg by mouth 3 (three) times daily as needed for moderate pain or severe pain. 09/06/20  Yes [provider]  ?metoprolol tartrate (LOPRESSOR) 25 MG tablet Take 0.5 tablets (12.5 mg total) by mouth daily as needed. ?Patient not taking: Reported on 10/27/2021 05/14/20   Minus Breeding, MD  ?   ? ?Allergies    ?Sulfa antibiotics   ? ?Review of Systems   ?Review of Systems  ?All other systems reviewed and are negative. ? ?Physical Exam ?Updated Vital Signs ?BP  140/78   Pulse 77   Temp 98.4 ?F (36.9 ?C) (Oral)   Resp (!) 23   Ht 1.651 m ('5\' 5"'$ )   Wt 75.3 kg   SpO2 94%   BMI 27.62 kg/m?  ?Physical Exam ?Vitals and nursing note reviewed.  ?Constitutional:   ?   General: She is not in acute distress. ?   Appearance: She is well-developed. She is not ill-appearing.  ?HENT:  ?   Head: Normocephalic and atraumatic.  ?   Mouth/Throat:  ?   Pharynx: Oropharynx is clear.  ?Eyes:  ?   Pupils: Pupils are equal, round, and reactive to light.  ?Cardiovascular:  ?   Rate and Rhythm: Tachycardia present. Rhythm irregular.  ?Pulmonary:  ?   Effort: Pulmonary effort is normal.  ?   Breath sounds: Normal breath sounds.  ?Chest:  ?   Chest  wall: No mass.  ?Abdominal:  ?   General: Bowel sounds are normal.  ?   Palpations: Abdomen is soft.  ?Musculoskeletal:     ?   General: Normal range of motion.  ?   Cervical back: Normal range of motion.  ?   Right lower leg: No tenderness. No edema.  ?   Left lower leg: No tenderness. No edema.  ?Skin: ?   General: Skin is warm.  ?   Capillary Refill: Capillary refill takes less than 2 seconds.  ?Neurological:  ?   General: No focal deficit present.  ?   Mental Status: She is alert.  ?   Cranial Nerves: No cranial nerve deficit.  ?   Motor: No weakness.  ?Psychiatric:     ?   Attention and Perception: Attention normal.     ?   Mood and Affect: Mood is anxious.     ?   Speech: Speech normal.     ?   Behavior: Behavior normal.     ?   Cognition and Memory: Cognition normal.     ?   Judgment: Judgment normal.  ? ? ?ED Results / Procedures / Treatments   ?Labs ?(all labs ordered are listed, but only abnormal results are displayed) ?Labs Reviewed  ?BASIC METABOLIC PANEL - Abnormal; Notable for the following components:  ?    Result Value  ? Sodium 129 (*)   ? Chloride 89 (*)   ? BUN <5 (*)   ? Creatinine, Ser 1.05 (*)   ? GFR, Estimated 55 (*)   ? Anion gap 17 (*)   ? All other components within normal limits  ?CBC - Abnormal; Notable for the following components:  ? Platelets 130 (*)   ? All other components within normal limits  ?URINALYSIS, ROUTINE W REFLEX MICROSCOPIC - Abnormal; Notable for the following components:  ? Color, Urine AMBER (*)   ? APPearance HAZY (*)   ? Hgb urine dipstick SMALL (*)   ? Ketones, ur 80 (*)   ? Nitrite POSITIVE (*)   ? Leukocytes,Ua LARGE (*)   ? Bacteria, UA FEW (*)   ? All other components within normal limits  ?HEPATIC FUNCTION PANEL - Abnormal; Notable for the following components:  ? AST 63 (*)   ? Total Bilirubin 2.4 (*)   ? Bilirubin, Direct 1.0 (*)   ? Indirect Bilirubin 1.4 (*)   ? All other components within normal limits  ?AMMONIA  ?PROTIME-INR  ?ETHANOL  ?LIPASE, BLOOD   ?DIGOXIN LEVEL  ?TROPONIN I (HIGH SENSITIVITY)  ?TROPONIN I (HIGH SENSITIVITY)  ? ? ?  EKG ?None ? ?Radiology ?CT ABDOMEN PELVIS W CONTRAST ? ?Result Date: 10/27/2021 ?CLINICAL DATA:  Abdominal pain, acute, nonlocalized. EXAM: CT ABDOMEN AND PELVIS WITH CONTRAST TECHNIQUE: Multidetector CT imaging of the abdomen and pelvis was performed using the standard protocol following bolus administration of intravenous contrast. RADIATION DOSE REDUCTION: This exam was performed according to the departmental dose-optimization program which includes automated exposure control, adjustment of the mA and/or kV according to patient size and/or use of iterative reconstruction technique. CONTRAST:  169m OMNIPAQUE IOHEXOL 300 MG/ML  SOLN COMPARISON:  Right upper quadrant abdominal ultrasound 05/19/2021 FINDINGS: Lower chest: No basilar lung consolidation or pleural effusion. Coronary atherosclerosis. Hepatobiliary: Numerous small calcified granulomas scattered throughout the liver. Hepatic steatosis. Mildly nodular liver contour with enlarged lateral segment of the left hepatic lobe. Mild chronic extrahepatic biliary prominence, likely physiologic following cholecystectomy. Pancreas: Diffuse pancreatic atrophy. No ductal dilatation or acute inflammation. Spleen: Numerous small calcified granulomas. Adrenals/Urinary Tract: Unremarkable adrenal glands. 7 mm peripherally calcified lesion in the upper pole of the right kidney, too small to fully characterize and for which no specific follow-up imaging is recommended. No hydronephrosis. Unremarkable bladder. Stomach/Bowel: Sequelae of gastric bypass are identified. A short segment small bowel intussusception is noted in the left mid abdomen. There is no evidence of associated bowel obstruction. Left-sided colonic diverticulosis is noted without evidence of acute diverticulitis. The appendix is unremarkable. Vascular/Lymphatic: Abdominal aortic atherosclerosis without aneurysm. No enlarged  lymph nodes. Reproductive: Uterus and bilateral adnexa are unremarkable. Other: No ascites or pneumoperitoneum. Musculoskeletal: Lumbar levoscoliosis with advanced disc and facet degeneration. IMPRESSION: 1. Short

## 2021-10-30 ENCOUNTER — Other Ambulatory Visit: Payer: Self-pay | Admitting: Cardiology

## 2021-11-05 DIAGNOSIS — L299 Pruritus, unspecified: Secondary | ICD-10-CM | POA: Diagnosis not present

## 2021-11-05 DIAGNOSIS — F419 Anxiety disorder, unspecified: Secondary | ICD-10-CM | POA: Diagnosis not present

## 2021-11-05 DIAGNOSIS — L309 Dermatitis, unspecified: Secondary | ICD-10-CM | POA: Diagnosis not present

## 2021-11-05 DIAGNOSIS — D696 Thrombocytopenia, unspecified: Secondary | ICD-10-CM | POA: Diagnosis not present

## 2021-11-05 DIAGNOSIS — I10 Essential (primary) hypertension: Secondary | ICD-10-CM | POA: Diagnosis not present

## 2021-11-05 DIAGNOSIS — R7303 Prediabetes: Secondary | ICD-10-CM | POA: Diagnosis not present

## 2021-11-05 DIAGNOSIS — I4811 Longstanding persistent atrial fibrillation: Secondary | ICD-10-CM | POA: Diagnosis not present

## 2021-11-05 DIAGNOSIS — K7030 Alcoholic cirrhosis of liver without ascites: Secondary | ICD-10-CM | POA: Diagnosis not present

## 2021-11-14 ENCOUNTER — Ambulatory Visit: Payer: Medicare Other | Admitting: Cardiology

## 2021-11-19 DIAGNOSIS — N3000 Acute cystitis without hematuria: Secondary | ICD-10-CM | POA: Diagnosis not present

## 2021-11-19 DIAGNOSIS — I10 Essential (primary) hypertension: Secondary | ICD-10-CM | POA: Diagnosis not present

## 2021-11-19 DIAGNOSIS — F419 Anxiety disorder, unspecified: Secondary | ICD-10-CM | POA: Diagnosis not present

## 2021-11-19 DIAGNOSIS — K7030 Alcoholic cirrhosis of liver without ascites: Secondary | ICD-10-CM | POA: Diagnosis not present

## 2021-11-19 DIAGNOSIS — L299 Pruritus, unspecified: Secondary | ICD-10-CM | POA: Diagnosis not present

## 2021-11-24 DIAGNOSIS — Z20822 Contact with and (suspected) exposure to covid-19: Secondary | ICD-10-CM | POA: Diagnosis not present

## 2021-11-25 DIAGNOSIS — L209 Atopic dermatitis, unspecified: Secondary | ICD-10-CM | POA: Diagnosis not present

## 2021-11-26 DIAGNOSIS — J441 Chronic obstructive pulmonary disease with (acute) exacerbation: Secondary | ICD-10-CM | POA: Diagnosis not present

## 2021-12-29 ENCOUNTER — Other Ambulatory Visit: Payer: Self-pay | Admitting: Cardiology

## 2022-01-09 DIAGNOSIS — J441 Chronic obstructive pulmonary disease with (acute) exacerbation: Secondary | ICD-10-CM | POA: Diagnosis not present

## 2022-01-09 DIAGNOSIS — F419 Anxiety disorder, unspecified: Secondary | ICD-10-CM | POA: Diagnosis not present

## 2022-01-18 DIAGNOSIS — J441 Chronic obstructive pulmonary disease with (acute) exacerbation: Secondary | ICD-10-CM | POA: Diagnosis present

## 2022-01-18 DIAGNOSIS — Z79899 Other long term (current) drug therapy: Secondary | ICD-10-CM | POA: Diagnosis not present

## 2022-01-18 DIAGNOSIS — G629 Polyneuropathy, unspecified: Secondary | ICD-10-CM | POA: Diagnosis present

## 2022-01-18 DIAGNOSIS — I4891 Unspecified atrial fibrillation: Secondary | ICD-10-CM | POA: Diagnosis present

## 2022-01-18 DIAGNOSIS — R0902 Hypoxemia: Secondary | ICD-10-CM | POA: Diagnosis present

## 2022-01-18 DIAGNOSIS — J449 Chronic obstructive pulmonary disease, unspecified: Secondary | ICD-10-CM | POA: Diagnosis not present

## 2022-01-18 DIAGNOSIS — I10 Essential (primary) hypertension: Secondary | ICD-10-CM | POA: Diagnosis present

## 2022-01-18 DIAGNOSIS — D696 Thrombocytopenia, unspecified: Secondary | ICD-10-CM | POA: Diagnosis present

## 2022-01-18 DIAGNOSIS — E871 Hypo-osmolality and hyponatremia: Secondary | ICD-10-CM | POA: Diagnosis present

## 2022-01-18 DIAGNOSIS — Z7901 Long term (current) use of anticoagulants: Secondary | ICD-10-CM | POA: Diagnosis not present

## 2022-01-18 DIAGNOSIS — Z66 Do not resuscitate: Secondary | ICD-10-CM | POA: Diagnosis not present

## 2022-01-18 DIAGNOSIS — Z20822 Contact with and (suspected) exposure to covid-19: Secondary | ICD-10-CM | POA: Diagnosis present

## 2022-01-18 DIAGNOSIS — R0602 Shortness of breath: Secondary | ICD-10-CM | POA: Diagnosis not present

## 2022-01-19 DIAGNOSIS — Z20822 Contact with and (suspected) exposure to covid-19: Secondary | ICD-10-CM | POA: Diagnosis present

## 2022-01-19 DIAGNOSIS — G629 Polyneuropathy, unspecified: Secondary | ICD-10-CM | POA: Diagnosis present

## 2022-01-19 DIAGNOSIS — I10 Essential (primary) hypertension: Secondary | ICD-10-CM | POA: Diagnosis present

## 2022-01-19 DIAGNOSIS — E871 Hypo-osmolality and hyponatremia: Secondary | ICD-10-CM | POA: Diagnosis present

## 2022-01-19 DIAGNOSIS — J449 Chronic obstructive pulmonary disease, unspecified: Secondary | ICD-10-CM | POA: Diagnosis not present

## 2022-01-19 DIAGNOSIS — Z66 Do not resuscitate: Secondary | ICD-10-CM | POA: Diagnosis not present

## 2022-01-19 DIAGNOSIS — Z7901 Long term (current) use of anticoagulants: Secondary | ICD-10-CM | POA: Diagnosis not present

## 2022-01-19 DIAGNOSIS — I4891 Unspecified atrial fibrillation: Secondary | ICD-10-CM | POA: Diagnosis present

## 2022-01-19 DIAGNOSIS — Z79899 Other long term (current) drug therapy: Secondary | ICD-10-CM | POA: Diagnosis not present

## 2022-01-19 DIAGNOSIS — R0902 Hypoxemia: Secondary | ICD-10-CM | POA: Diagnosis present

## 2022-01-19 DIAGNOSIS — D696 Thrombocytopenia, unspecified: Secondary | ICD-10-CM | POA: Diagnosis present

## 2022-01-19 DIAGNOSIS — J441 Chronic obstructive pulmonary disease with (acute) exacerbation: Secondary | ICD-10-CM | POA: Diagnosis present

## 2022-01-23 ENCOUNTER — Encounter (HOSPITAL_COMMUNITY): Payer: Self-pay | Admitting: Cardiology

## 2022-01-26 DIAGNOSIS — D696 Thrombocytopenia, unspecified: Secondary | ICD-10-CM | POA: Diagnosis not present

## 2022-01-26 DIAGNOSIS — E871 Hypo-osmolality and hyponatremia: Secondary | ICD-10-CM | POA: Diagnosis not present

## 2022-01-27 ENCOUNTER — Other Ambulatory Visit: Payer: Self-pay | Admitting: Cardiology

## 2022-01-28 NOTE — Telephone Encounter (Signed)
Prescription refill request for Eliquis received.  Indication: afib  Last office visit: hochrein, 03/03/2021 Scr: 1.05, 10/27/2021 Age: 76 yo  Weight: 75.3 kg   Refill sent.

## 2022-02-27 ENCOUNTER — Other Ambulatory Visit: Payer: Self-pay | Admitting: Cardiology

## 2022-03-27 ENCOUNTER — Other Ambulatory Visit: Payer: Self-pay | Admitting: Cardiology

## 2022-04-22 ENCOUNTER — Telehealth: Payer: Self-pay

## 2022-04-22 DIAGNOSIS — R932 Abnormal findings on diagnostic imaging of liver and biliary tract: Secondary | ICD-10-CM

## 2022-04-22 DIAGNOSIS — K760 Fatty (change of) liver, not elsewhere classified: Secondary | ICD-10-CM

## 2022-04-22 NOTE — Telephone Encounter (Signed)
-----   Message from Roetta Sessions, [place] sent at 10/30/2021 10:45 AM EDT ----- Regarding: Needs U/S Patient will need complete U/S in October to rule out cirrhosis,  fatty liver

## 2022-04-22 NOTE — Telephone Encounter (Signed)
Called and scheduled patient at [facility] on Monday,  October 9th at 9:00 am arr at 8:30 am. NPO after midnight.  MyChart message sent to patient re appointment.

## 2022-04-23 ENCOUNTER — Telehealth: Payer: Self-pay

## 2022-04-23 NOTE — Telephone Encounter (Signed)
Called and cancelled ultrasound.

## 2022-04-23 NOTE — Telephone Encounter (Signed)
Patient has moved to [place].  RUQ ultrasound cancelled. Address updated in chart per patient request:  [place], [place]

## 2022-04-23 NOTE — Telephone Encounter (Signed)
-----   Message from Roetta Sessions, [place] sent at 04/22/2022  5:08 PM EDT ----- Regarding: cancel RUQ Patient no longer lives in [facility] area)

## 2022-04-28 ENCOUNTER — Other Ambulatory Visit: Payer: Self-pay | Admitting: Cardiology

## 2022-05-04 ENCOUNTER — Ambulatory Visit (HOSPITAL_COMMUNITY): Payer: Medicare Other

## 2022-07-27 ENCOUNTER — Other Ambulatory Visit: Payer: Self-pay | Admitting: Cardiology

## 2022-08-13 IMAGING — MR MR LUMBAR SPINE W/O CM
4 of 5 series · 19 of 48 positions shown · non-contrast
Comparison: None.

CLINICAL DATA: Scoliosis.  Severe low back pain

EXAM:
MRI LUMBAR SPINE WITHOUT CONTRAST
TECHNIQUE: Multiplanar, multisequence MR imaging of the lumbar spine was
performed. No intravenous contrast was administered.

[Series 5: T2 · sagittal · 4.0mm · 0.73mm/px · 8 of 22 slices shown (1 of 2)]
[im 1/22]
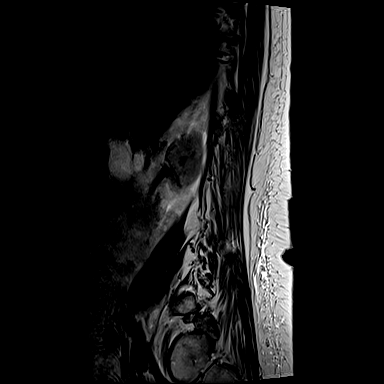
[im 4/22]
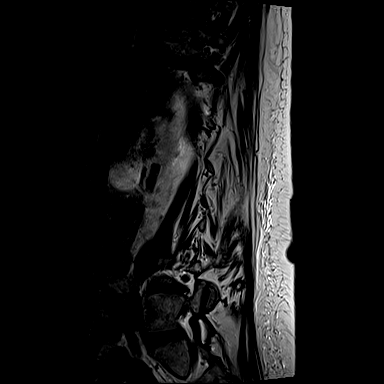
[im 7/22]
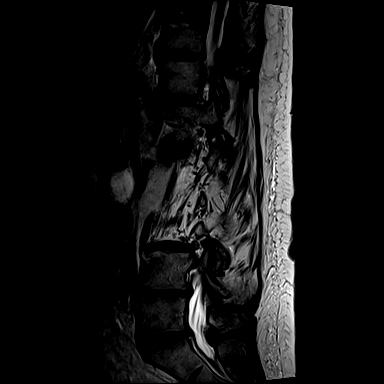
[im 10/22]
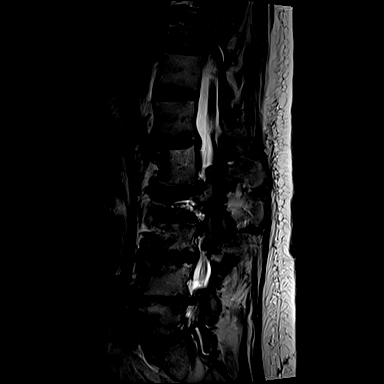
[im 13/22]
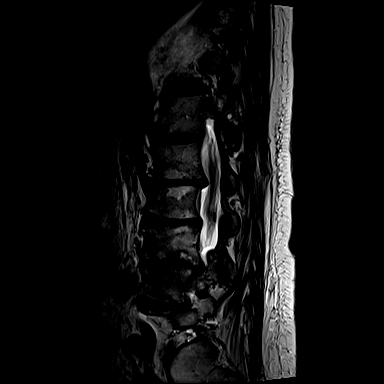
[im 16/22]
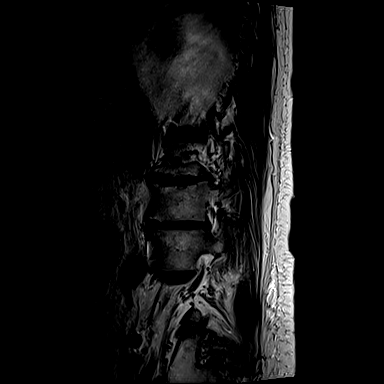
[im 19/22]
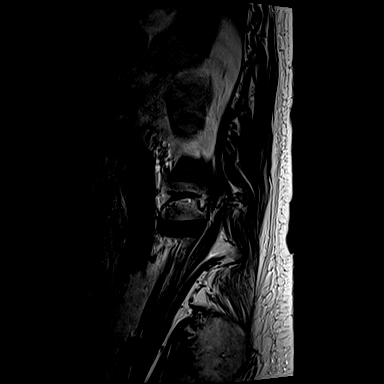
[im 22/22]
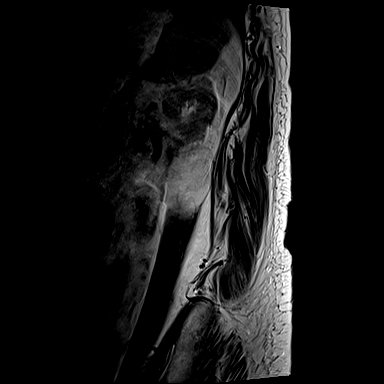

[Series 6: T1 · sagittal · 4.0mm · 0.73mm/px · 3 of 22 slices shown (1 of 2)]
[im 4/22]
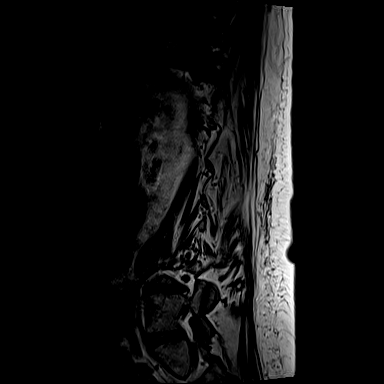
[im 13/22]
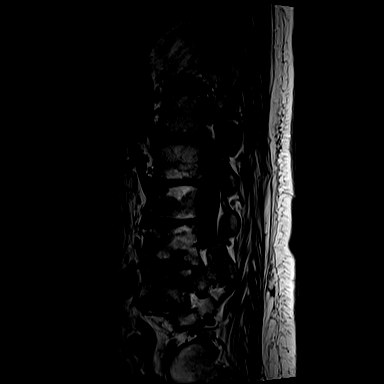
[im 19/22]
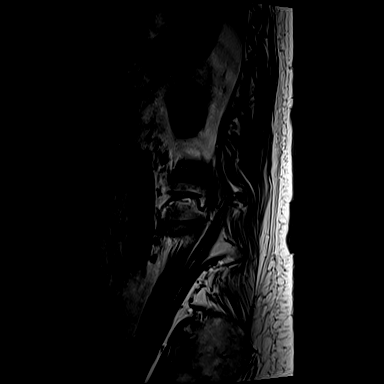

[Series 10: T1 · axial · 4.0mm · 0.28mm/px · z∈[-48,+72]mm · 3 of 34 slices shown (2 of 2)]
[im 7/34]
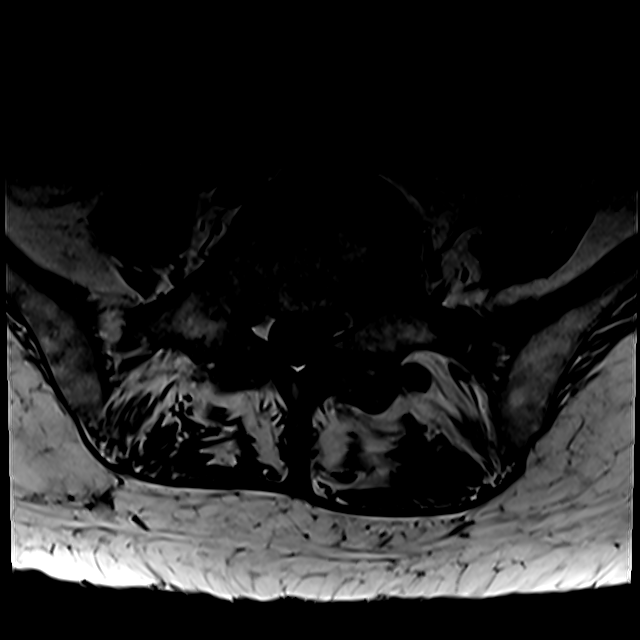
[im 19/34]
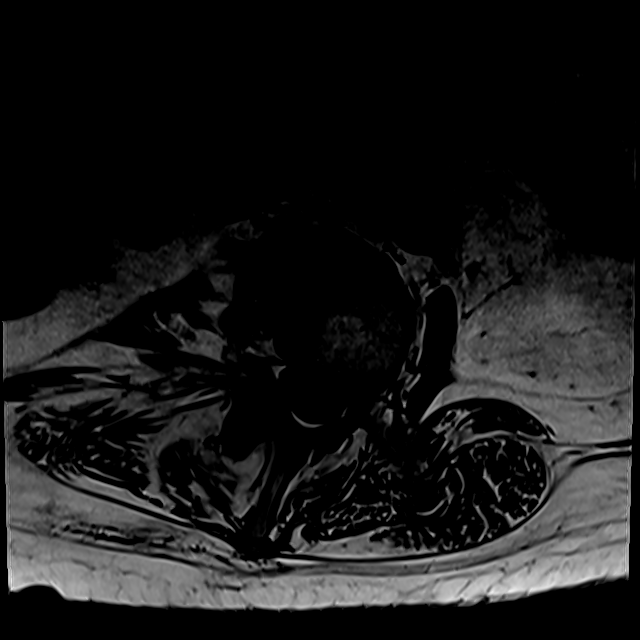
[im 31/34]
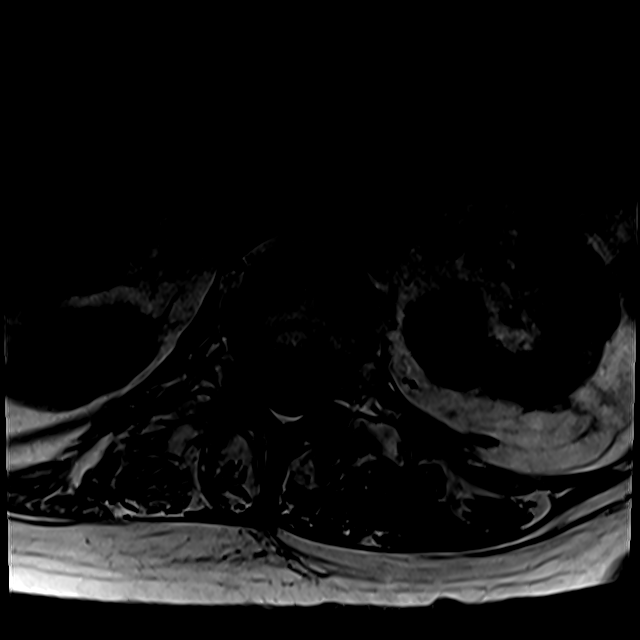

[Series 13: T2 · axial · 4.0mm · 0.28mm/px · z∈[-78,+72]mm · 5 of 34 slices shown (2 of 2)]
[im 1/34]
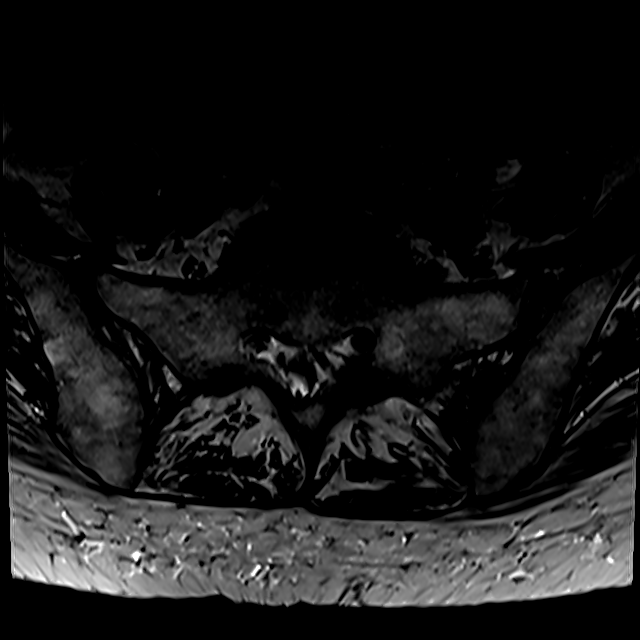
[im 7/34]
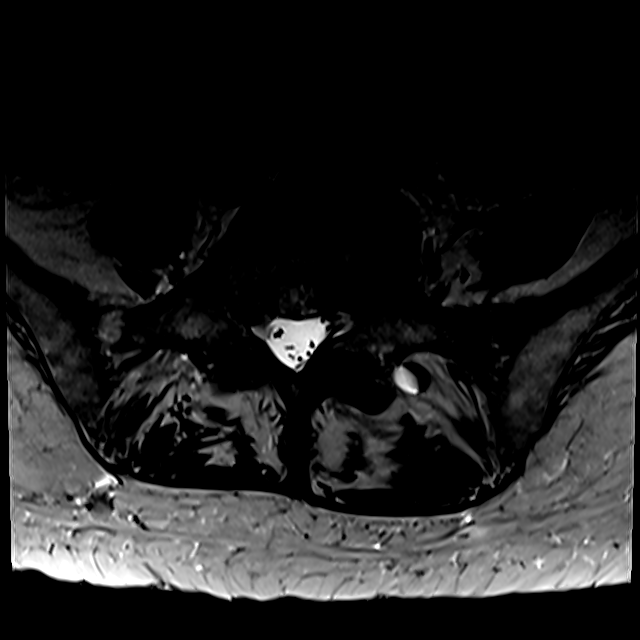
[im 10/34]
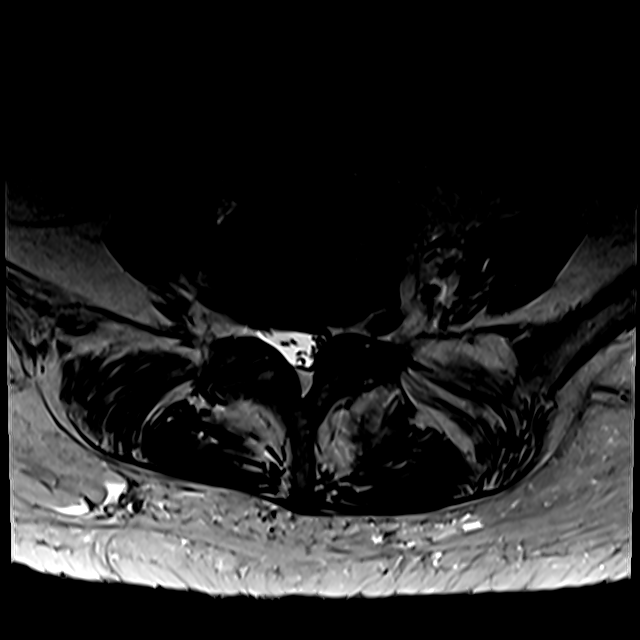
[im 19/34]
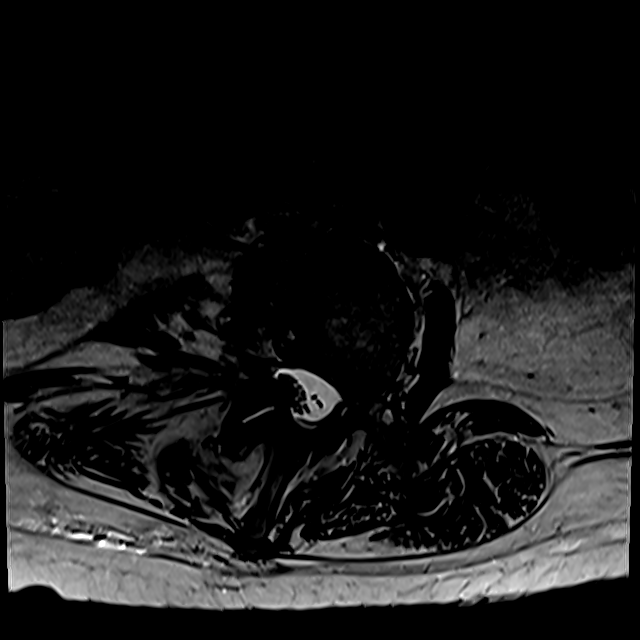
[im 31/34]
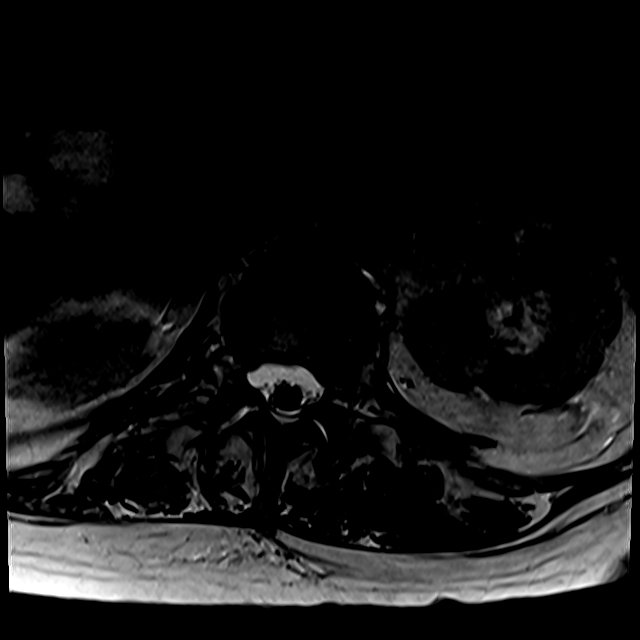

[19 of 48 positions shown; findings below may reference images not displayed]

FINDINGS: Segmentation: Hypoplastic ribs at the level numbered T12. No
available chest imaging for rib numbering.

Alignment:  Levoscoliosis and reversal of lumbar lordosis

Vertebrae:  No fracture, evidence of discitis, or bone lesion.

Conus medullaris and cauda equina: Conus extends to the L1 level.
Conus and cauda equina appear normal.

Paraspinal and other soft tissues: Intra and extrahepatic bile duct
dilatation with filling defect at the distal CBD measuring 14 mm on
sagittal images. CBD diameter is up to 27 mm.

No perispinal mass or inflammation seen. Muscular atrophy about the
spine.

Disc levels:

T12- L1: Disc narrowing and bulging.  Mild facet spurring.

L1-L2: Disc collapse towards the right with endplate ridging and
endplate degeneration. Right more than left facet osteoarthritis.
Moderate spinal stenosis. The foramina are patent

L2-L3: Asymmetric rightward disc collapse and endplate degeneration.
Asymmetric rightward endplate spurring and facet spurring. Right
foraminal narrowing but not clearly compressive.

L3-L4: Disc narrowing and endplate degeneration with facet spurring
asymmetric to the right. Moderate spinal stenosis. Moderate
biforaminal narrowing

L4-L5: Disc narrowing with asymmetric left-sided collapse and
spurring. Asymmetric left facet spurring with small synovial cyst.
There is left subarticular recess crowding without static
impingement

L5-S1:Asymmetric leftward disc narrowing and bulging. Asymmetric
left facet osteoarthritis. Bilateral synovial cysts associated with
the facets, on the left encroaching on the foramen where there is
moderate stenosis.

These results will be called to the ordering clinician or
representative by the Radiologist Assistant, and communication
documented in the PACS or [[email or web address]].
IMPRESSION: 1. Choledocholithiasis with marked bile duct dilatation that has
progressed from a 2929 ultrasound.
2. Diffuse disc and facet degeneration with levoscoliosis.
3. Moderate spinal stenosis at L1-2 and L3-4.
4. Moderate foraminal narrowing on both sides at L3-4 and on the
left at L5-S1.
